# Patient Record
Sex: Female | Born: 1980 | Hispanic: No | Smoking: Former smoker
Health system: Southern US, Community
[De-identification: ages and names within clinical notes are randomized; demographics above are authoritative.]

## PROBLEM LIST (undated history)

## (undated) DIAGNOSIS — F329 Major depressive disorder, single episode, unspecified: Secondary | ICD-10-CM

## (undated) DIAGNOSIS — F32A Depression, unspecified: Secondary | ICD-10-CM

## (undated) DIAGNOSIS — F419 Anxiety disorder, unspecified: Secondary | ICD-10-CM

## (undated) DIAGNOSIS — G8929 Other chronic pain: Secondary | ICD-10-CM

## (undated) DIAGNOSIS — Z87891 Personal history of nicotine dependence: Secondary | ICD-10-CM

## (undated) DIAGNOSIS — M25519 Pain in unspecified shoulder: Secondary | ICD-10-CM

## (undated) HISTORY — DX: Pain in unspecified shoulder: M25.519

## (undated) HISTORY — DX: Other chronic pain: G89.29

## (undated) HISTORY — PX: ARTHROSCOPIC REPAIR ACL: SUR80

## (undated) HISTORY — DX: Personal history of nicotine dependence: Z87.891

---

## 2004-06-15 ENCOUNTER — Other Ambulatory Visit: Admission: RE | Admit: 2004-06-15 | Discharge: 2004-06-15 | Payer: Self-pay | Admitting: Obstetrics and Gynecology

## 2011-03-02 ENCOUNTER — Emergency Department (HOSPITAL_COMMUNITY)
Admission: EM | Admit: 2011-03-02 | Discharge: 2011-03-03 | Disposition: A | Discharge status: Home / Self Care | Payer: PRIVATE HEALTH INSURANCE | Attending: Emergency Medicine | Admitting: Emergency Medicine

## 2011-03-02 ENCOUNTER — Emergency Department (HOSPITAL_COMMUNITY): Payer: PRIVATE HEALTH INSURANCE

## 2011-03-02 ENCOUNTER — Encounter: Payer: Self-pay | Admitting: Emergency Medicine

## 2011-03-02 DIAGNOSIS — S8990XA Unspecified injury of unspecified lower leg, initial encounter: Secondary | ICD-10-CM | POA: Insufficient documentation

## 2011-03-02 DIAGNOSIS — IMO0002 Reserved for concepts with insufficient information to code with codable children: Secondary | ICD-10-CM | POA: Insufficient documentation

## 2011-03-02 DIAGNOSIS — M25569 Pain in unspecified knee: Secondary | ICD-10-CM | POA: Insufficient documentation

## 2011-03-02 DIAGNOSIS — S82899A Other fracture of unspecified lower leg, initial encounter for closed fracture: Secondary | ICD-10-CM | POA: Insufficient documentation

## 2011-03-02 HISTORY — DX: Major depressive disorder, single episode, unspecified: F32.9

## 2011-03-02 HISTORY — DX: Anxiety disorder, unspecified: F41.9

## 2011-03-02 HISTORY — DX: Depression, unspecified: F32.A

## 2011-03-02 MED ORDER — IBUPROFEN 600 MG PO TABS: 600.0000 mg | ORAL_TABLET | Freq: Four times a day (QID) | ORAL | Status: AC | PRN

## 2011-03-02 MED ORDER — OXYCODONE-ACETAMINOPHEN 5-325 MG PO TABS: 1.0000 | ORAL_TABLET | ORAL | Status: AC | PRN

## 2011-03-02 MED ORDER — IBUPROFEN 800 MG PO TABS: 800.0000 mg | ORAL_TABLET | Freq: Once | ORAL | Status: DC

## 2011-03-02 MED ORDER — MORPHINE SULFATE 4 MG/ML IJ SOLN
6.0000 mg | Freq: Once | INTRAMUSCULAR | Status: AC
  Administered 2011-03-02: 6 mg via INTRAMUSCULAR
  Filled 2011-03-02: qty 2

## 2011-03-02 MED ORDER — OXYCODONE-ACETAMINOPHEN 5-325 MG PO TABS: 1.0000 | ORAL_TABLET | Freq: Once | ORAL | Status: DC

## 2011-03-02 NOTE — ED Notes (Signed)
Pt presented to the Er with c/o of a left knee pain secondary to left knee injury and fall, pt states she was tripped by a dog, pt states "I twisted the leg wrong way and fell". Pt level at this time upon movement 8/10, at best 3/10. Pt able to move the left leg and the knee however with some difficulty. Also noted minimal swelling to the lateral left side. No bruising or deformity noted at this time.

## 2011-03-02 NOTE — ED Provider Notes (Signed)
History     CSN: [id number]  Arrival date & time 03/02/11  1958   First MD Initiated Contact with Patient 03/02/11 2135      Chief Complaint  Patient presents with  . Fall  . Knee Injury  . Knee Pain    (Consider location/radiation/quality/duration/timing/severity/associated sxs/prior treatment) Patient is a 30 y.o. female presenting with knee pain. The history is provided by the patient.  Knee Pain The current episode started today. The problem occurs constantly. The problem has been unchanged. Associated symptoms include joint swelling. Pertinent negatives include no chills or fever. The symptoms are aggravated by bending, standing, walking and twisting. She has tried nothing for the symptoms.  Pt states she was getting out of a car when a 30lb dog jumped on her and she fell down. Reports pain and swelling to left knee, unble to bear weight. No other injuries.   Past Medical History  Diagnosis Date  . Depression   . Anxiety     History reviewed. No pertinent past surgical history.  History reviewed. No pertinent family history.  History  Substance Use Topics  . Smoking status: Current Some Day Smoker  . Smokeless tobacco: Not on file  . Alcohol Use: Yes    OB History    Grav Para Term Preterm Abortions TAB SAB Ect Mult Living                  Review of Systems  Constitutional: Negative for fever and chills.  HENT: Negative.   Eyes: Negative.   Respiratory: Negative.   Cardiovascular: Negative.   Gastrointestinal: Negative.   Genitourinary: Negative.   Musculoskeletal: Positive for joint swelling.  Skin: Negative.   Neurological: Negative.   Psychiatric/Behavioral: Negative.     Allergies  Review of patient's allergies indicates no known allergies.  Home Medications   Current Outpatient Rx  Name Route Sig Dispense Refill  . ALPRAZOLAM 0.5 MG PO TABS Oral Take 0.5 mg by mouth at bedtime as needed. Take 0.25 mg to 0.5 mg at bedtime as needed for  anxiety     . BUPROPION HCL ER (XL) 150 MG PO TB24 Oral Take 150 mg by mouth daily.      . ETONOGESTREL-ETHINYL ESTRADIOL 0.12-0.015 MG/24HR VA RING Vaginal Place 1 each vaginally every 28 (twenty-eight) days. Insert vaginally and leave in place for 3 consecutive weeks, then remove for 1 week.     Marland Kitchen FLUOXETINE HCL 40 MG PO CAPS Oral Take 40 mg by mouth daily after breakfast.      . ADULT MULTIVITAMIN W/MINERALS CH Oral Take 1 tablet by mouth daily.      Marland Kitchen PSEUDOEPHEDRINE-APAP-DM 91-478-29 MG/30ML PO LIQD Oral Take 15 mLs by mouth every 4 (four) hours as needed. For congestion relief       BP 103/66  Pulse 82  Temp(Src) 98.7 F (37.1 C) (Oral)  Resp 18  SpO2 100%  Physical Exam  Constitutional: She is oriented to person, place, and time. She appears well-developed and well-nourished.  HENT:  Head: Atraumatic.  Neck: Neck supple.  Cardiovascular: Normal rate, regular rhythm and normal heart sounds.   Pulmonary/Chest: Effort normal and breath sounds normal. No respiratory distress.  Abdominal: Soft. Bowel sounds are normal. She exhibits no distension.  Musculoskeletal: She exhibits edema and tenderness.       Left knee swelling noted. Tender to palpation over posterior knee, and anterior knee just medial to the patella. Pt able to raise the leg off the stretcher. Patella appears  intact. No pain or laxaty with medial or lateral stress. Unable to flex knee due to pain and swelling. Unable assess joint stability due to pain. Normal ankle and hip  Neurological: She is alert and oriented to person, place, and time.  Skin: Skin is warm and dry.  Psychiatric: She has a normal mood and affect.    ED Course  Procedures (including critical care time)  No results found for this or any previous visit. Dg Knee Complete 4 Views Left  03/02/2011  *RADIOLOGY REPORT*  Clinical Data: Knocked down by dog; left knee pain.  LEFT KNEE - COMPLETE 4+ VIEW  Comparison: None.  Findings: There is a comminuted  fracture involving the tibial eminence, with several displaced fragments.  This raises concern for disruption of the distal insertion of the anterior cruciate ligament.  An associated large lipohemarthrosis is seen.  No additional fractures are identified.  The joint spaces are preserved.  No significant degenerative change is seen; the patellofemoral joint is otherwise unremarkable in appearance.  IMPRESSION: Comminuted fracture involving the tibial eminence, with several displaced fragments; this raises concern for disruption of the distal insertion of the anterior cruciate ligament.  Associated large lipohemarthrosis seen.  Original Report Authenticated By: Tonia Ghent, M.D.    Spoke with Dr. Jerl Santos, orthopedics, regarding x-ray findings. Recommended immobilizer, crutches, follow up with them in the office. Discussed x-ray findings with pt. Will d/c home  No diagnosis found.    MDM          Lottie Mussel, PA 03/03/11 [phone]

## 2011-03-02 NOTE — ED Notes (Signed)
Pt returned from xray dept. 

## 2011-03-03 NOTE — ED Notes (Signed)
14 inch knee immobilizer placed on left knee,  Crutches given for walking,  Pt wheeled out by wheelchair

## 2011-03-03 NOTE — ED Provider Notes (Signed)
Medical screening examination/treatment/procedure(s) were performed by non-physician practitioner and as supervising physician I was immediately available for consultation/collaboration.  Vartan Kerins M Jessicalynn Deshong, MD 03/03/11 1313 

## 2011-07-22 ENCOUNTER — Ambulatory Visit (INDEPENDENT_AMBULATORY_CARE_PROVIDER_SITE_OTHER): Payer: PRIVATE HEALTH INSURANCE | Admitting: Emergency Medicine

## 2011-07-22 VITALS — BP 114/74 | HR 91 | Temp 98.0°F | Resp 16 | Ht 64.5 in | Wt 128.2 lb

## 2011-07-22 DIAGNOSIS — B373 Candidiasis of vulva and vagina: Secondary | ICD-10-CM

## 2011-07-22 DIAGNOSIS — N390 Urinary tract infection, site not specified: Secondary | ICD-10-CM

## 2011-07-22 LAB — POCT URINALYSIS DIPSTICK
Protein, UA: NEGATIVE
pH, UA: 7

## 2011-07-22 LAB — POCT UA - MICROSCOPIC ONLY
Crystals, Ur, HPF, POC: NEGATIVE
Yeast, UA: NEGATIVE

## 2011-07-22 MED ORDER — CIPROFLOXACIN HCL 250 MG PO TABS: 250.0000 mg | ORAL_TABLET | Freq: Two times a day (BID) | ORAL | Status: AC

## 2011-07-22 MED ORDER — FLUCONAZOLE 150 MG PO TABS: ORAL_TABLET | ORAL | Status: DC

## 2011-07-22 NOTE — Progress Notes (Signed)
  Subjective:    Patient ID: Zoe Ryan, female    DOB: October 29, 1980, 31 y.o.   MRN: [id number]  HPI patient enters with a 24-hour history of burning on urination. She has a history of urinary tract infections. She feels a lot of discomfort in her lower abdomen with burning at the end of urination.    Review of Systems she denies fevers chills. She denies back pain. She uses a NuvaRing for birth control. Her menstrual cycles have been normal she's had no new sexual partner     Objective:   Physical Exam limited to the abdomen. There is mild tenderness in the suprapubic area. There is no tenderness in the flank areas.   Results for orders placed in visit on 07/22/11  POCT UA - MICROSCOPIC ONLY      Component Value Range   WBC, Ur, HPF, POC 8-15     RBC, urine, microscopic 1-4     Bacteria, U Microscopic 1+     Mucus, UA neg     Epithelial cells, urine per micros 0-3     Crystals, Ur, HPF, POC neg     Casts, Ur, LPF, POC neg     Yeast, UA neg    POCT URINALYSIS DIPSTICK      Component Value Range   Color, UA Bright yellow     Clarity, UA clear     Glucose, UA neg     Bilirubin, UA neg     Ketones, UA neg     Spec Grav, UA 1.015     Blood, UA neg     pH, UA 7.0     Protein, UA neg     Urobilinogen, UA 0.2     Nitrite, UA positve     Leukocytes, UA moderate (2+)         Assessment & Plan:       Patient seen with urinary tract infection. We'll culture urine with Cipro 250 twice a day x7 days. Patient given a prescription for Diflucan.

## 2011-07-22 NOTE — Patient Instructions (Signed)

## 2011-07-26 ENCOUNTER — Telehealth: Payer: Self-pay | Admitting: *Deleted

## 2011-07-26 NOTE — Telephone Encounter (Signed)
We left results of urine culture and pt called back and stated she is still having abdominal pain and dysuria.  She only has about 3 days left of antibiotic.  Would you like for her to RTC or is there anything we can do without her coming in?

## 2011-07-26 NOTE — Telephone Encounter (Signed)
Spoke with patient and advised to RTC and voiced understanding .

## 2011-07-26 NOTE — Telephone Encounter (Signed)
If does not resolve should RTC for further testing.

## 2011-07-27 ENCOUNTER — Ambulatory Visit (INDEPENDENT_AMBULATORY_CARE_PROVIDER_SITE_OTHER): Payer: PRIVATE HEALTH INSURANCE | Admitting: Family Medicine

## 2011-07-27 VITALS — BP 101/68 | HR 93 | Temp 99.0°F | Resp 16 | Ht 64.0 in | Wt 128.0 lb

## 2011-07-27 DIAGNOSIS — N949 Unspecified condition associated with female genital organs and menstrual cycle: Secondary | ICD-10-CM

## 2011-07-27 DIAGNOSIS — N390 Urinary tract infection, site not specified: Secondary | ICD-10-CM

## 2011-07-27 DIAGNOSIS — R102 Pelvic and perineal pain: Secondary | ICD-10-CM

## 2011-07-27 LAB — POCT URINALYSIS DIPSTICK
Bilirubin, UA: NEGATIVE
Blood, UA: NEGATIVE
Ketones, UA: NEGATIVE
Leukocytes, UA: NEGATIVE
pH, UA: 6

## 2011-07-27 LAB — POCT UA - MICROSCOPIC ONLY
Bacteria, U Microscopic: NEGATIVE
Casts, Ur, LPF, POC: NEGATIVE
Mucus, UA: NEGATIVE

## 2011-07-27 LAB — POCT WET PREP WITH KOH: Yeast Wet Prep HPF POC: NEGATIVE

## 2011-07-27 MED ORDER — DOXYCYCLINE HYCLATE 100 MG PO TABS: 100.0000 mg | ORAL_TABLET | Freq: Two times a day (BID) | ORAL | Status: AC

## 2011-07-27 MED ORDER — FLUCONAZOLE 150 MG PO TABS: ORAL_TABLET | ORAL | Status: DC

## 2011-07-27 MED ORDER — DOXYCYCLINE HYCLATE 100 MG PO TABS: 100.0000 mg | ORAL_TABLET | Freq: Two times a day (BID) | ORAL | Status: DC

## 2011-07-27 NOTE — Progress Notes (Signed)
  Subjective:    Patient ID: Margarete Horace, female    DOB: [birth year], 31 y.o.   MRN: [id number]  HPI  Patient presents for follow up of UTI UC grew Coagulase negative Staph Day #6 Cipro  Continues to experience dysuria and suprapubic pain Chills Nausea and dizziness patient attributes to Cipro  Vaginal discomfort Denies new sexual contacts  Review of Systems     Objective:   Physical Exam  Constitutional: She appears well-developed.  Neck: Neck supple.  Cardiovascular: Normal rate, regular rhythm and normal heart sounds.   Pulmonary/Chest: Effort normal and breath sounds normal.  Abdominal: Soft. There is Tenderness: suprapubic.Marland Kitchen  Neurological: She is alert.  Skin: Skin is warm.       Results for orders placed in visit on 07/27/11  POCT URINALYSIS DIPSTICK      Component Value Range   Color, UA yellow     Clarity, UA clear     Glucose, UA neg     Bilirubin, UA neg     Ketones, UA neg     Spec Grav, UA 1.010     Blood, UA neg     pH, UA 6.0     Protein, UA neg     Urobilinogen, UA 0.2     Nitrite, UA neg     Leukocytes, UA Negative    POCT UA - MICROSCOPIC ONLY      Component Value Range   WBC, Ur, HPF, POC neg     RBC, urine, microscopic neg     Bacteria, U Microscopic neg     Mucus, UA neg     Epithelial cells, urine per micros 0-1     Crystals, Ur, HPF, POC neg     Casts, Ur, LPF, POC neg     Yeast, UA neg    POCT WET PREP WITH KOH      Component Value Range   Trichomonas, UA Negative     Clue Cells Wet Prep HPF POC 2-5     Epithelial Wet Prep HPF POC 3-6     Yeast Wet Prep HPF POC negative     Bacteria Wet Prep HPF POC 2+     RBC Wet Prep HPF POC 0-3     WBC Wet Prep HPF POC 2-5     KOH Prep POC Positive          Assessment & Plan:  Dysuria; recent urine culture with cogulase negative staph Yeast vaginitis  Doxycycline 100 mg 1 po BID X 7 days Diflucan UAD If symptoms persist patient will follow up for pelvic exam

## 2012-01-01 ENCOUNTER — Other Ambulatory Visit: Payer: Self-pay

## 2012-01-01 MED ORDER — ETONOGESTREL-ETHINYL ESTRADIOL 0.12-0.015 MG/24HR VA RING: VAGINAL_RING | VAGINAL | Status: DC

## 2012-03-03 ENCOUNTER — Ambulatory Visit (INDEPENDENT_AMBULATORY_CARE_PROVIDER_SITE_OTHER): Payer: PRIVATE HEALTH INSURANCE | Admitting: Family Medicine

## 2012-03-03 ENCOUNTER — Encounter: Payer: Self-pay | Admitting: Family Medicine

## 2012-03-03 VITALS — BP 120/81 | HR 72 | Temp 98.2°F | Resp 16 | Ht 65.5 in | Wt 125.0 lb

## 2012-03-03 DIAGNOSIS — Z0000 Encounter for general adult medical examination without abnormal findings: Secondary | ICD-10-CM

## 2012-03-03 MED ORDER — ETONOGESTREL-ETHINYL ESTRADIOL 0.12-0.015 MG/24HR VA RING: VAGINAL_RING | VAGINAL | Status: DC

## 2012-03-03 NOTE — Progress Notes (Signed)
Subjective:    Patient ID: Zoe Ryan, female    DOB: Jul 06, 1980, 31 y.o.   MRN: [id number]  HPI  [name] is a pleasant 31 yo female with no sig PMHx. She has been stable on her psychiatric medications for over 7 yrs and is followed by Dr. Tomasa Rand at [facility]. She has no plans to consider conception anytime soon and knows that her medications would need to be reviewed before this.   She is using nuvaring for contraception - she is often using the rings back to back to eliminate periods which is fine. She is taking a mvi and making sure to get her calcium/vit D. Doing monthly self breast exams.  Thinks she may have had abnormal pap once but repeat was normal.  Past Medical History  Diagnosis Date  . Depression   . Anxiety   . Chronic shoulder pain   . Tobacco user    Past Surgical History  Procedure Date  . Arthroscopic repair acl    Family History  Problem Relation Age of Onset  . Anxiety disorder Maternal Grandmother   . Congestive Heart Failure Maternal Grandfather   . Stroke Paternal Grandfather    History   Social History  . Marital Status: Single    Spouse Name: N/A    Number of Children: N/A  . Years of Education: N/A   Occupational History  . 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 Smoking status: Current Some Day Smoker  . Smokeless tobacco: Not on file  . Alcohol Use: Yes  . Drug Use: No  . Sexually Active: Yes    Birth Control/ Protection: Pill   Other Topics Concern  . Not on file   Social History Narrative  . No narrative on file   Current Outpatient Prescriptions on File Prior to Visit  Medication Sig Dispense Refill  . ALPRAZolam (XANAX) 0.5 MG tablet Take 0.5 mg by mouth at bedtime as needed. Take 0.25 mg to 0.5 mg at bedtime as needed for anxiety       . buPROPion (WELLBUTRIN XL) 150 MG 24 hr tablet Take 150 mg by mouth daily.        Marland Kitchen etonogestrel-ethinyl estradiol (NUVARING) 0.12-0.015  MG/24HR vaginal ring Insert 1 ring and leave in place for 3 weeks. Then remove for 1 week and repeat cycle with new ring.  3 each  11  . FLUoxetine (PROZAC) 40 MG capsule Take 40 mg by mouth daily after breakfast.        . Multiple Vitamin (MULITIVITAMIN WITH MINERALS) TABS Take 1 tablet by mouth daily.        . fluconazole (DIFLUCAN) 150 MG tablet UAD  3 tablet  1  . Pseudoephedrine-APAP-DM (DAYQUIL MULTI-SYMPTOM) 60-650-20 MG/30ML LIQD Take 15 mLs by mouth every 4 (four) hours as needed. For congestion relief        No Known Allergies   Review of Systems  Constitutional: Negative for fever, chills, diaphoresis, activity change, appetite change, fatigue and unexpected weight change.  HENT: Negative for hearing loss, ear pain, nosebleeds, congestion, sore throat, facial swelling, rhinorrhea, sneezing, drooling, mouth sores, trouble swallowing, neck pain, neck stiffness, dental problem, voice change, postnasal drip, sinus pressure, tinnitus and ear discharge.   Eyes: Negative for photophobia, pain, discharge, redness, itching and visual disturbance.  Respiratory: Negative for apnea, cough, choking, chest tightness, shortness of breath, wheezing and stridor.   Cardiovascular: Negative for chest pain, palpitations  and leg swelling.  Gastrointestinal: Negative for nausea, vomiting, abdominal pain, diarrhea, constipation, blood in stool, abdominal distention, anal bleeding and rectal pain.  Genitourinary: Negative for dysuria, urgency, frequency, hematuria, flank pain, decreased urine volume, vaginal bleeding, vaginal discharge, enuresis, difficulty urinating, genital sores, vaginal pain, menstrual problem, pelvic pain and dyspareunia.  Musculoskeletal: Negative for myalgias, back pain, joint swelling, arthralgias and gait problem.  Skin: Negative for color change, pallor, rash and wound.  Neurological: Negative for dizziness, tremors, seizures, syncope, facial asymmetry, speech difficulty, weakness,  light-headedness, numbness and headaches.  Hematological: Negative for adenopathy. Does not bruise/bleed easily.  Psychiatric/Behavioral: Negative for suicidal ideas, hallucinations, behavioral problems, confusion, sleep disturbance, self-injury, dysphoric mood, decreased concentration and agitation. The patient is not nervous/anxious and is not hyperactive.       BP 120/81  Pulse 72  Temp 98.2 F (36.8 C)  Resp 16  Ht 5' 5.5" (1.664 m)  Wt 125 lb (56.7 kg)  BMI 20.48 kg/m2  LMP 02/09/2012 Objective:   Physical Exam  Constitutional: She is oriented to person, place, and time. She appears well-developed and well-nourished. No distress.  HENT:  Head: Normocephalic and atraumatic.  Right Ear: Tympanic membrane, external ear and ear canal normal.  Left Ear: Tympanic membrane, external ear and ear canal normal.  Nose: Nose normal. No rhinorrhea.  Mouth/Throat: Uvula is midline, oropharynx is clear and moist and mucous membranes are normal. No posterior oropharyngeal erythema.  Eyes: Conjunctivae normal and EOM are normal. Pupils are equal, round, and reactive to light. Right eye exhibits no discharge. Left eye exhibits no discharge. No scleral icterus.  Neck: Normal range of motion. Neck supple. No thyromegaly present.  Cardiovascular: Normal rate, regular rhythm, normal heart sounds and intact distal pulses.   Pulmonary/Chest: Effort normal and breath sounds normal. No respiratory distress.  Abdominal: Soft. Bowel sounds are normal. There is no tenderness.  Genitourinary: Vagina normal and uterus normal. No breast swelling, tenderness, discharge or bleeding. Cervix exhibits no motion tenderness and no friability. Right adnexum displays no mass and no tenderness. Left adnexum displays no mass and no tenderness.  Musculoskeletal: She exhibits no edema.  Lymphadenopathy:    She has no cervical adenopathy.  Neurological: She is alert and oriented to person, place, and time. She has normal  reflexes.  Skin: Skin is warm and dry. She is not diaphoretic. No erythema.  Psychiatric: She has a normal mood and affect. Her behavior is normal.      Assessment & Plan:  1. HM - Pap repeated this yr - could have deferred for 2 more years but decided to go ahead and do this yr along w/ HR HPV testing as if both are negative, can transition to pap smear only every 5 yrs - so none until 2018. Flu shot at work, TDaP done 12/16/10

## 2012-03-09 LAB — PAP IG AND HPV HIGH-RISK

## 2013-02-26 ENCOUNTER — Ambulatory Visit: Payer: PRIVATE HEALTH INSURANCE

## 2013-02-26 ENCOUNTER — Ambulatory Visit (INDEPENDENT_AMBULATORY_CARE_PROVIDER_SITE_OTHER): Payer: PRIVATE HEALTH INSURANCE | Admitting: Physician Assistant

## 2013-02-26 VITALS — BP 122/78 | HR 100 | Temp 99.1°F | Resp 18 | Ht 64.0 in | Wt 121.0 lb

## 2013-02-26 DIAGNOSIS — R05 Cough: Secondary | ICD-10-CM

## 2013-02-26 LAB — POCT CBC
Lymph, poc: 2.1 (ref 0.6–3.4)
MCH, POC: 32.8 pg — AB (ref 27–31.2)
MCHC: 32.3 g/dL (ref 31.8–35.4)
MCV: 101.2 fL — AB (ref 80–97)
MID (cbc): 0.6 (ref 0–0.9)
POC LYMPH PERCENT: 44.4 %L (ref 10–50)
Platelet Count, POC: 264 10*3/uL (ref 142–424)
RDW, POC: 13.1 %
WBC: 4.7 10*3/uL (ref 4.6–10.2)

## 2013-02-26 MED ORDER — HYDROCOD POLST-CHLORPHEN POLST 10-8 MG/5ML PO LQCR: 5.0000 mL | Freq: Two times a day (BID) | ORAL | Status: DC | PRN

## 2013-02-26 MED ORDER — GUAIFENESIN ER 1200 MG PO TB12: 1.0000 | ORAL_TABLET | Freq: Two times a day (BID) | ORAL | Status: DC | PRN

## 2013-02-26 MED ORDER — IPRATROPIUM BROMIDE 0.03 % NA SOLN: 2.0000 | Freq: Two times a day (BID) | NASAL | Status: DC

## 2013-02-26 MED ORDER — AZITHROMYCIN 250 MG PO TABS: ORAL_TABLET | ORAL | Status: DC

## 2013-02-26 NOTE — Progress Notes (Signed)
   Subjective:    Patient ID: Zoe Ryan, female    DOB: 1980/11/11, 32 y.o.   MRN: [id number]  PCP: No primary provider on file.  Chief Complaint  Patient presents with  . Cough    x Wednesday  . Nasal Congestion    HPI Began with laryngitis, then coughing.  "Then it kind of went into my head."  Cough produces a clearish-yellowish sputum. Same color drainage from her nose. Subjective fever/chills. Tmax 99.8. No nausea, vomiting, diarrhea, though has coughed to the point of gagging and vomiting. No unexplained muscle/joint aches, but hurts to cough.   Review of Systems As above.    Objective:   Physical Exam Blood pressure 122/78, pulse 100, temperature 99.1 F (37.3 C), temperature source Oral, resp. rate 18, height 5\' 4"  (1.626 m), weight 121 lb (54.885 kg), SpO2 96.00%. Body mass index is 20.76 kg/(m^2). Well-developed, well nourished female who is awake, alert and oriented, in NAD. HEENT: [place]/AT, PERRL, EOMI.  Sclera and conjunctiva are clear.  EAC are patent, TMs are normal in appearance. Nasal mucosa is pink and moist. OP is clear. Neck: supple, non-tender, no lymphadenopathy, thyromegaly. Heart: RRR, no murmur Lungs: normal effort, wheezes noted in the bases. Abdomen: normo-active bowel sounds, supple, non-tender, no mass or organomegaly. Extremities: no cyanosis, clubbing or edema. Skin: warm and dry without rash. Psychologic: good mood and appropriate affect, normal speech and behavior.    CXR: [facility] reading (PRIMARY) by  Dr. Alwyn Ren.  Heavy markings.  No infiltrates.  Results for orders placed in visit on 02/26/13  POCT CBC      Result Value Range   WBC 4.7  4.6 - 10.2 K/uL   Lymph, poc 2.1  0.6 - 3.4   POC LYMPH PERCENT 44.4  10 - 50 %L   MID (cbc) 0.6  0 - 0.9   POC MID % 12.0  0 - 12 %M   POC Granulocyte 2.0  2 - 6.9   Granulocyte percent 43.6  37 - 80 %G   RBC 4.61  4.04 - 5.48 M/uL   Hemoglobin 15.1  12.2 - 16.2 g/dL   HCT, POC 40.9  81.1 - 47.9 %   MCV 101.2 (*) 80 - 97 fL   MCH, POC 32.8 (*) 27 - 31.2 pg   MCHC 32.3  31.8 - 35.4 g/dL   RDW, POC 91.4     Platelet Count, POC 264  142 - 424 K/uL   MPV 7.1  0 - 99.8 fL       Assessment & Plan:  1. Cough bronchitis - POCT CBC - DG Chest 2 View; Future - azithromycin (ZITHROMAX) 250 MG tablet; Take 2 tabs PO x 1 dose, then 1 tab PO QD x 4 days  Dispense: 6 tablet; Refill: 0 - ipratropium (ATROVENT) 0.03 % nasal spray; Place 2 sprays into both nostrils 2 (two) times daily.  Dispense: 30 mL; Refill: 0 - Guaifenesin (MUCINEX MAXIMUM STRENGTH) 1200 MG TB12; Take 1 tablet (1,200 mg total) by mouth every 12 (twelve) hours as needed.  Dispense: 14 tablet; Refill: 1 - chlorpheniramine-HYDROcodone (TUSSIONEX PENNKINETIC ER) 10-8 MG/5ML LQCR; Take 5 mLs by mouth every 12 (twelve) hours as needed for cough (cough).  Dispense: 100 mL; Refill: 0   Fernande Bras, PA-C Physician Assistant-Certified [facility]

## 2013-02-26 NOTE — Patient Instructions (Signed)
Get plenty of rest and drink at least 64 ounces of water daily. 

## 2013-04-22 ENCOUNTER — Other Ambulatory Visit: Payer: Self-pay | Admitting: Family Medicine

## 2013-05-31 ENCOUNTER — Other Ambulatory Visit: Payer: Self-pay | Admitting: Family Medicine

## 2013-06-01 ENCOUNTER — Other Ambulatory Visit: Payer: Self-pay | Admitting: Family Medicine

## 2013-06-01 ENCOUNTER — Telehealth: Payer: Self-pay

## 2013-06-01 NOTE — Telephone Encounter (Signed)
PHARMACY WOULD NOT GIVE PT REFILL OF BIRTH CONTROL. PT WANTS  TO KNOW IF SHE NEEDS TO COME BACK IN AND BE SEEN TO GET A REFILL.

## 2013-06-03 NOTE — Telephone Encounter (Signed)
Her last CPE was 02/2012 - she needs to come in for wellness exam

## 2013-06-04 MED ORDER — ETONOGESTREL-ETHINYL ESTRADIOL 0.12-0.015 MG/24HR VA RING: VAGINAL_RING | VAGINAL | Status: DC

## 2013-06-04 NOTE — Telephone Encounter (Signed)
Spoke to pt- she was transferred to make an appt with Chelle. [facility]  Sent in one month supply- Verbal from Chelle ok to do so. Verified pharmacy.

## 2013-06-21 ENCOUNTER — Ambulatory Visit (INDEPENDENT_AMBULATORY_CARE_PROVIDER_SITE_OTHER): Payer: PRIVATE HEALTH INSURANCE | Admitting: Emergency Medicine

## 2013-06-21 DIAGNOSIS — Z3049 Encounter for surveillance of other contraceptives: Secondary | ICD-10-CM

## 2013-06-21 DIAGNOSIS — Z0000 Encounter for general adult medical examination without abnormal findings: Secondary | ICD-10-CM

## 2013-06-21 LAB — POCT URINALYSIS DIPSTICK
Bilirubin, UA: NEGATIVE
GLUCOSE UA: NEGATIVE
Ketones, UA: NEGATIVE
Leukocytes, UA: NEGATIVE
Nitrite, UA: NEGATIVE
PH UA: 6
Protein, UA: NEGATIVE
RBC UA: NEGATIVE
UROBILINOGEN UA: 0.2

## 2013-06-21 LAB — POCT CBC
Granulocyte percent: 57.4 %G (ref 37–80)
HCT, POC: 45.1 % (ref 37.7–47.9)
Hemoglobin: 14.9 g/dL (ref 12.2–16.2)
Lymph, poc: 1.9 (ref 0.6–3.4)
MCH, POC: 33 pg — AB (ref 27–31.2)
MCHC: 33 g/dL (ref 31.8–35.4)
MCV: 99.9 fL — AB (ref 80–97)
MID (cbc): 0.3 (ref 0–0.9)
MPV: 7.6 fL (ref 0–99.8)
POC Granulocyte: 3 (ref 2–6.9)
POC LYMPH PERCENT: 36.4 %L (ref 10–50)
POC MID %: 6.2 %M (ref 0–12)
Platelet Count, POC: 408 10*3/uL (ref 142–424)
RBC: 4.51 M/uL (ref 4.04–5.48)
RDW, POC: 13.2 %
WBC: 5.2 10*3/uL (ref 4.6–10.2)

## 2013-06-21 LAB — POCT UA - MICROSCOPIC ONLY
Bacteria, U Microscopic: NEGATIVE
Casts, Ur, LPF, POC: NEGATIVE
Crystals, Ur, HPF, POC: NEGATIVE
Epithelial cells, urine per micros: NEGATIVE
Mucus, UA: NEGATIVE
RBC, urine, microscopic: NEGATIVE
Yeast, UA: NEGATIVE

## 2013-06-21 LAB — COMPREHENSIVE METABOLIC PANEL
ALT: 20 U/L (ref 0–35)
AST: 21 U/L (ref 0–37)
Albumin: 4.5 g/dL (ref 3.5–5.2)
Alkaline Phosphatase: 60 U/L (ref 39–117)
BILIRUBIN TOTAL: 0.3 mg/dL (ref 0.2–1.2)
BUN: 8 mg/dL (ref 6–23)
CALCIUM: 9.7 mg/dL (ref 8.4–10.5)
CHLORIDE: 101 meq/L (ref 96–112)
CO2: 25 meq/L (ref 19–32)
CREATININE: 0.59 mg/dL (ref 0.50–1.10)
Glucose, Bld: 73 mg/dL (ref 70–99)
Potassium: 4.4 mEq/L (ref 3.5–5.3)
SODIUM: 136 meq/L (ref 135–145)
TOTAL PROTEIN: 8.2 g/dL (ref 6.0–8.3)

## 2013-06-21 LAB — TSH: TSH: 1.922 u[IU]/mL (ref 0.350–4.500)

## 2013-06-21 LAB — LIPID PANEL
CHOLESTEROL: 211 mg/dL — AB (ref 0–200)
HDL: 114 mg/dL (ref >39)
LDL Cholesterol: 82 mg/dL (ref 0–99)
Total CHOL/HDL Ratio: 1.9 Ratio
Triglycerides: 74 mg/dL (ref <150)
VLDL: 15 mg/dL (ref 0–40)

## 2013-06-21 MED ORDER — ETONOGESTREL-ETHINYL ESTRADIOL 0.12-0.015 MG/24HR VA RING: VAGINAL_RING | VAGINAL | Status: DC

## 2013-06-21 NOTE — Progress Notes (Signed)
[facility] [address], [place] [id number] [phone]  Date:  06/21/2013   Name:  Zoe Ryan   DOB:  07/23/1980   MRN:  [id number]  PCP:  No primary provider on file.    Chief Complaint: Annual Exam   History of Present Illness:  Zoe Ryan is a 33 y.o. very pleasant female patient who presents with the following:  Here for refill on contraceptive and for routine annual examination.  Non smoker.  No improvement with over the counter medications or other home remedies. Denies other complaint or health concern today.   There are no active problems to display for this patient.   Past Medical History  Diagnosis Date  . Depression   . Anxiety   . Chronic shoulder pain   . History of tobacco use     Quit 2013    Past Surgical History  Procedure Laterality Date  . Arthroscopic repair acl      History  Substance Use Topics  . Smoking status: Former Games developermoker  . Smokeless tobacco: Never Used  . Alcohol Use: 6.6 oz/week    11 Cans of beer per week    Family History  Problem Relation Age of Onset  . Anxiety disorder Maternal Grandmother   . Congestive Heart Failure Maternal Grandfather   . Stroke Paternal Grandfather   . Mental illness Brother     depression    No Known Allergies  Medication list has been reviewed and updated.  Current Outpatient Prescriptions on File Prior to Visit  Medication Sig Dispense Refill  . ALPRAZolam (XANAX) 0.5 MG tablet Take 0.5 mg by mouth at bedtime as needed. Take 0.25 mg to 0.5 mg at bedtime as needed for anxiety       . buPROPion (WELLBUTRIN XL) 150 MG 24 hr tablet Take 150 mg by mouth daily.        Marland Kitchen. etonogestrel-ethinyl estradiol (NUVARING) 0.12-0.015 MG/24HR vaginal ring Insert 1 ring vaginally once a month for 3 weeks, then remove for 1 wk. PATIENT NEEDS OFFICE VISIT FOR ADDITIONAL REFILLS  1 each  0  . FLUoxetine (PROZAC) 40 MG capsule Take 40 mg by mouth daily after breakfast.        . Multiple  Vitamin (MULITIVITAMIN WITH MINERALS) TABS Take 1 tablet by mouth daily.        Marland Kitchen. azithromycin (ZITHROMAX) 250 MG tablet Take 2 tabs PO x 1 dose, then 1 tab PO QD x 4 days  6 tablet  0  . chlorpheniramine-HYDROcodone (TUSSIONEX PENNKINETIC ER) 10-8 MG/5ML LQCR Take 5 mLs by mouth every 12 (twelve) hours as needed for cough (cough).  100 mL  0  . Guaifenesin (MUCINEX MAXIMUM STRENGTH) 1200 MG TB12 Take 1 tablet (1,200 mg total) by mouth every 12 (twelve) hours as needed.  14 tablet  1  . ipratropium (ATROVENT) 0.03 % nasal spray Place 2 sprays into both nostrils 2 (two) times daily.  30 mL  0  . Pseudoephedrine-APAP-DM (DAYQUIL MULTI-SYMPTOM) 60-650-20 MG/30ML LIQD Take 15 mLs by mouth every 4 (four) hours as needed. For congestion relief        No current facility-administered medications on file prior to visit.    Review of Systems:  As per HPI, otherwise negative.    Physical Examination: There were no vitals filed for this visit. There were no vitals filed for this visit. There is no weight on file to calculate BMI. Ideal Body Weight:    GEN: WDWN, NAD,  Non-toxic, A & O x 3 HEENT: Atraumatic, Normocephalic. Neck supple. No masses, No LAD. Ears and Nose: No external deformity. CV: RRR, No M/G/R. No JVD. No thrill. No extra heart sounds. PULM: CTA B, no wheezes, crackles, rhonchi. No retractions. No resp. distress. No accessory muscle use. ABD: S, NT, ND, +BS. No rebound. No HSM. EXTR: No c/c/e NEURO Normal gait.  PSYCH: Normally interactive. Conversant. Not depressed or anxious appearing.  Calm demeanor.    Assessment and Plan: Wellness examination Labs  Signed,  Phillips OdorJeffery Codey Burling, MD

## 2013-06-21 NOTE — Patient Instructions (Signed)

## 2013-07-27 ENCOUNTER — Encounter: Payer: PRIVATE HEALTH INSURANCE | Admitting: Family Medicine

## 2014-06-23 ENCOUNTER — Other Ambulatory Visit: Payer: Self-pay | Admitting: Emergency Medicine

## 2014-06-25 ENCOUNTER — Ambulatory Visit: Payer: PRIVATE HEALTH INSURANCE | Admitting: Family Medicine

## 2014-07-23 ENCOUNTER — Encounter: Payer: Self-pay | Admitting: Family Medicine

## 2014-07-23 ENCOUNTER — Ambulatory Visit (INDEPENDENT_AMBULATORY_CARE_PROVIDER_SITE_OTHER): Payer: PRIVATE HEALTH INSURANCE | Admitting: Family Medicine

## 2014-07-23 VITALS — BP 123/85 | HR 106 | Temp 98.8°F | Resp 16 | Ht 64.5 in | Wt 136.2 lb

## 2014-07-23 DIAGNOSIS — Z3009 Encounter for other general counseling and advice on contraception: Secondary | ICD-10-CM

## 2014-07-23 DIAGNOSIS — Z1389 Encounter for screening for other disorder: Secondary | ICD-10-CM | POA: Diagnosis not present

## 2014-07-23 DIAGNOSIS — Z139 Encounter for screening, unspecified: Secondary | ICD-10-CM

## 2014-07-23 DIAGNOSIS — R5383 Other fatigue: Secondary | ICD-10-CM | POA: Diagnosis not present

## 2014-07-23 DIAGNOSIS — Z0000 Encounter for general adult medical examination without abnormal findings: Secondary | ICD-10-CM | POA: Diagnosis not present

## 2014-07-23 DIAGNOSIS — J302 Other seasonal allergic rhinitis: Secondary | ICD-10-CM

## 2014-07-23 LAB — COMPREHENSIVE METABOLIC PANEL
ALT: 19 U/L (ref 0–35)
AST: 17 U/L (ref 0–37)
Albumin: 3.8 g/dL (ref 3.5–5.2)
Alkaline Phosphatase: 66 U/L (ref 39–117)
BILIRUBIN TOTAL: 0.3 mg/dL (ref 0.2–1.2)
BUN: 7 mg/dL (ref 6–23)
CO2: 22 mEq/L (ref 19–32)
Calcium: 8.5 mg/dL (ref 8.4–10.5)
Chloride: 100 mEq/L (ref 96–112)
Creat: 0.63 mg/dL (ref 0.50–1.10)
Glucose, Bld: 79 mg/dL (ref 70–99)
Potassium: 4.4 mEq/L (ref 3.5–5.3)
Sodium: 133 mEq/L — ABNORMAL LOW (ref 135–145)
Total Protein: 7.3 g/dL (ref 6.0–8.3)

## 2014-07-23 LAB — POCT URINALYSIS DIPSTICK
BILIRUBIN UA: NEGATIVE
Glucose, UA: NEGATIVE
Ketones, UA: NEGATIVE
Nitrite, UA: NEGATIVE
PH UA: 6
Protein, UA: NEGATIVE
RBC UA: NEGATIVE
SPEC GRAV UA: 1.015
Urobilinogen, UA: 0.2

## 2014-07-23 LAB — CBC
HCT: 40.7 % (ref 36.0–46.0)
Hemoglobin: 14 g/dL (ref 12.0–15.0)
MCH: 32.8 pg (ref 26.0–34.0)
MCHC: 34.4 g/dL (ref 30.0–36.0)
MCV: 95.3 fL (ref 78.0–100.0)
MPV: 8.8 fL (ref 8.6–12.4)
Platelets: 335 10*3/uL (ref 150–400)
RBC: 4.27 MIL/uL (ref 3.87–5.11)
RDW: 13.5 % (ref 11.5–15.5)
WBC: 6.3 10*3/uL (ref 4.0–10.5)

## 2014-07-23 LAB — TSH: TSH: 2.205 u[IU]/mL (ref 0.350–4.500)

## 2014-07-23 MED ORDER — ETONOGESTREL-ETHINYL ESTRADIOL 0.12-0.015 MG/24HR VA RING: VAGINAL_RING | VAGINAL | Status: DC

## 2014-07-23 MED ORDER — FLUTICASONE PROPIONATE 50 MCG/ACT NA SUSP: 2.0000 | Freq: Every day | NASAL | Status: DC

## 2014-07-23 NOTE — Progress Notes (Signed)
Subjective:    Patient ID: Norva Pavlovnna Haugan, female    DOB: 07/15/1980, 34 y.o.   MRN: [id number]  HPI This is a pleasant 34 yo female who presents today for CPE.   Last CPE- 06/21/13 Mammo- NA Pap- 03/03/13- normal, never abnormal Tdap- 12/16/10 Flu- annual Eye- regular Dental- regular Exercise-dance  Has some night time awakening. Falls back to sleep easukt. Doesn't feel rested. This is chronic with acute exacerbation. Work is stressful. 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 New equipment, new computer program, understaffed. Other aspects of her life are stable. She has been seeing Tiajuana AmassScott Cunningham for psych meds. He has left and she is trying to get in with a new provider. Mood good on current meds.   Past Medical History  Diagnosis Date  . Depression   . Anxiety   . Chronic shoulder pain   . History of tobacco use     Quit 2013   Past Surgical History  Procedure Laterality Date  . Arthroscopic repair acl     Family History  Problem Relation Age of Onset  . Anxiety disorder Maternal Grandmother   . Congestive Heart Failure Maternal Grandfather   . Stroke Paternal Grandfather   . Mental illness Brother     depression   History  Substance Use Topics  . Smoking status: Former Games developermoker  . Smokeless tobacco: Never Used  . Alcohol Use: 6.6 oz/week    11 Cans of beer per week   Medications, allergies, past medical history, surgical history, family history, social history and problem list reviewed and updated.   Review of Systems  Constitutional: Positive for unexpected weight change (feels like she has gained 5-10 pounds in last year).  HENT: Positive for postnasal drip (negative allergy workup. ) and tinnitus.   Eyes: Negative.   Respiratory: Negative.   Cardiovascular: Negative.   Gastrointestinal: Negative.   Endocrine: Negative.   Genitourinary: Negative.   Musculoskeletal: Positive for arthralgias (right shoulder/arm pain 2-3x per month. Better with  regular exercise. Relief with ibuprofen 400 mg.).  Skin: Negative.   Allergic/Immunologic: Negative.   Neurological: Negative.   Hematological: Negative.   Psychiatric/Behavioral: Negative.       Objective:   Physical Exam Physical Exam  Constitutional: She is oriented to person, place, and time. She appears well-developed and well-nourished. No distress.  HENT:  Head: Normocephalic and atraumatic.  Right Ear: External ear normal.  Left Ear: External ear normal.  Nose: Nose with mucosal edema.  Mouth/Throat: Oropharynx slightly erythematous. No oropharyngeal exudate.  Eyes: Conjunctivae are normal. Pupils are equal, round, and reactive to light.  Neck: Normal range of motion. Neck supple. No JVD present. No thyromegaly present.  Cardiovascular: Normal rate, regular rhythm, normal heart sounds and intact distal pulses.   Pulmonary/Chest: Effort normal and breath sounds normal. Abdominal: Soft. Bowel sounds are normal. She exhibits no distension and no mass. There is no tenderness. There is no rebound and no guarding.  Genitourinary: Vagina normal. Pelvic exam was performed with patient supine. There is no rash, tenderness, lesion or injury on the right labia. There is no rash, tenderness, lesion or injury on the left labia. Cervix exhibits no motion tenderness and no discharge. No vaginal discharge found.  Musculoskeletal: Normal range of motion. She exhibits no edema or tenderness.  Lymphadenopathy:    She has no cervical adenopathy.  Neurological: She is alert and oriented to person, place, and time. She has normal reflexes.  Skin: Skin is warm and dry.  She is not diaphoretic.  Psychiatric: She has a normal mood and affect. Her behavior is normal. Judgment and thought content normal.  Vitals reviewed.  BP 123/85 mmHg  Pulse 106  Temp(Src) 98.8 F (37.1 C) (Oral)  Resp 16  Ht 5' 4.5" (1.638 m)  Wt 136 lb 3.2 oz (61.78 kg)  BMI 23.03 kg/m2  SpO2 95% HR during auscultation 88.       Assessment & Plan:  1. Annual physical exam - Encouraged regular exercise, adding yoga/meditation/mindfulness to routine  2. Other seasonal allergic rhinitis - Provided written and verbal information regarding diagnosis and treatment. - fluticasone (FLONASE) 50 MCG/ACT nasal spray; Place 2 sprays into both nostrils daily.  Dispense: 16 g; Refill: 6  3. Other fatigue - CBC - Comprehensive metabolic panel - TSH - Vit D  25 hydroxy (rtn osteoporosis monitoring)  4. Screening for hematuria or proteinuria - POCT urinalysis dipstick  5. Screening for nephropathy - Comprehensive metabolic panel  6. Encounter for other general counseling or advice on contraception - etonogestrel-ethinyl estradiol (NUVARING) 0.12-0.015 MG/24HR vaginal ring; Insert 1 ring vaginally once a month for 3 wks, then remove for 1 wk.  Dispense: 1 each; Refill: 3   Olean Reeeborah Chany Woolworth, FNP-BC  [facility]  07/23/2014 12:14 PM

## 2014-07-23 NOTE — Patient Instructions (Signed)

## 2014-07-24 ENCOUNTER — Encounter: Payer: Self-pay | Admitting: Family Medicine

## 2014-07-24 ENCOUNTER — Other Ambulatory Visit: Payer: Self-pay | Admitting: Family Medicine

## 2014-07-24 DIAGNOSIS — E559 Vitamin D deficiency, unspecified: Secondary | ICD-10-CM

## 2014-07-24 LAB — VITAMIN D 25 HYDROXY (VIT D DEFICIENCY, FRACTURES): VIT D 25 HYDROXY: 7 ng/mL — AB (ref 30–100)

## 2015-06-25 ENCOUNTER — Ambulatory Visit (INDEPENDENT_AMBULATORY_CARE_PROVIDER_SITE_OTHER): Payer: PRIVATE HEALTH INSURANCE | Admitting: Urgent Care

## 2015-06-25 VITALS — BP 108/60 | HR 93 | Temp 97.9°F | Resp 18 | Ht 64.5 in | Wt 146.0 lb

## 2015-06-25 DIAGNOSIS — R300 Dysuria: Secondary | ICD-10-CM | POA: Diagnosis not present

## 2015-06-25 DIAGNOSIS — R102 Pelvic and perineal pain: Secondary | ICD-10-CM | POA: Diagnosis not present

## 2015-06-25 DIAGNOSIS — R350 Frequency of micturition: Secondary | ICD-10-CM

## 2015-06-25 DIAGNOSIS — N3090 Cystitis, unspecified without hematuria: Secondary | ICD-10-CM | POA: Diagnosis not present

## 2015-06-25 LAB — POC MICROSCOPIC URINALYSIS (UMFC): Mucus: ABSENT

## 2015-06-25 LAB — POCT URINALYSIS DIP (MANUAL ENTRY)
Bilirubin, UA: NEGATIVE
Glucose, UA: NEGATIVE
Ketones, POC UA: NEGATIVE
Nitrite, UA: NEGATIVE
Protein Ur, POC: 30 — AB
Spec Grav, UA: 1.02
Urobilinogen, UA: 0.2
pH, UA: 5.5

## 2015-06-25 MED ORDER — CIPROFLOXACIN HCL 500 MG PO TABS: 500.0000 mg | ORAL_TABLET | Freq: Two times a day (BID) | ORAL | Status: DC

## 2015-06-25 MED ORDER — FLUCONAZOLE 150 MG PO TABS: 150.0000 mg | ORAL_TABLET | Freq: Once | ORAL | Status: DC

## 2015-06-25 NOTE — Patient Instructions (Addendum)
 Urinary Tract Infection Urinary tract infections (UTIs) can develop anywhere along your urinary tract. Your urinary tract is your body's drainage system for removing wastes and extra water. Your urinary tract includes two kidneys, two ureters, a bladder, and a urethra. Your kidneys are a pair of bean-shaped organs. Each kidney is about the size of your fist. They are located below your ribs, one on each side of your spine. CAUSES Infections are caused by microbes, which are microscopic organisms, including fungi, viruses, and bacteria. These organisms are so small that they can only be seen through a microscope. Bacteria are the microbes that most commonly cause UTIs. SYMPTOMS  Symptoms of UTIs may vary by age and gender of the patient and by the location of the infection. Symptoms in young women typically include a frequent and intense urge to urinate and a painful, burning feeling in the bladder or urethra during urination. Older women and men are more likely to be tired, shaky, and weak and have muscle aches and abdominal pain. A fever may mean the infection is in your kidneys. Other symptoms of a kidney infection include pain in your back or sides below the ribs, nausea, and vomiting. DIAGNOSIS To diagnose a UTI, your caregiver will ask you about your symptoms. Your caregiver will also ask you to provide a urine sample. The urine sample will be tested for bacteria and white blood cells. White blood cells are made by your body to help fight infection. TREATMENT  Typically, UTIs can be treated with medication. Because most UTIs are caused by a bacterial infection, they usually can be treated with the use of antibiotics. The choice of antibiotic and length of treatment depend on your symptoms and the type of bacteria causing your infection. HOME CARE INSTRUCTIONS  If you were prescribed antibiotics, take them exactly as your caregiver instructs you. Finish the medication even if you feel better  after you have only taken some of the medication.  Drink enough water and fluids to keep your urine clear or pale yellow.  Avoid caffeine, tea, and carbonated beverages. They tend to irritate your bladder.  Empty your bladder often. Avoid holding urine for long periods of time.  Empty your bladder before and after sexual intercourse.  After a bowel movement, women should cleanse from front to back. Use each tissue only once. SEEK MEDICAL CARE IF:   You have back pain.  You develop a fever.  Your symptoms do not begin to resolve within 3 days. SEEK IMMEDIATE MEDICAL CARE IF:   You have severe back pain or lower abdominal pain.  You develop chills.  You have nausea or vomiting.  You have continued burning or discomfort with urination. MAKE SURE YOU:   Understand these instructions.  Will watch your condition.  Will get help right away if you are not doing well or get worse.   This information is not intended to replace advice given to you by your health care provider. Make sure you discuss any questions you have with your health care provider.   Document Released: 12/02/2004 Document Revised: 11/13/2014 Document Reviewed: 04/02/2011 Elsevier Interactive Patient Education 2016 Elsevier Inc[employer]    IF you received an x-ray today, you will receive an invoice from [facility]. Please contact [facility] at [phone] with questions or concerns regarding your invoice.   IF you received labwork today, you will receive an invoice from [facility]. Please contact [facility] at [phone] with questions or concerns regarding your invoice.     Our billing staff will not be able to assist you with questions regarding bills from these companies.  You will be contacted with the lab results as soon as they are available. The fastest way to get your results is to activate your My Chart account. Instructions are located on the last page of this  paperwork. If you have not heard from us regarding the results in 2 weeks, please contact this office.     

## 2015-06-25 NOTE — Progress Notes (Signed)
    MRN: [id number] DOB: 10/24/1980  Subjective:   Zoe Ryan is a 35 y.o. female presenting for chief complaint of Urinary Frequency and Abdominal Pain  Reports 1 day history of dysuria, urinary frequency and pelvic pain. Has not tried any medications for relief. Of note, patient was seen by another practice 1 month ago and treated for pyelonephritis. Denies fever, hematuria, flank pain, abdominal pain, cloudy malordorous urine, genital rash, genital irritation and vaginal discharge, nausea and vomiting.   Zoe Ryan has a current medication list which includes the following prescription(s): alprazolam, bupropion, etonogestrel-ethinyl estradiol, fluticasone, multivitamin with minerals, sertraline hcl, and UNABLE TO FIND. Patient has No Known Allergies.  Zoe Ryan  has a past medical history of Depression; Anxiety; Chronic shoulder pain; and History of tobacco use. Also  has past surgical history that includes Arthroscopic repair ACL.  Objective:   Vitals: BP 108/60 mmHg  Pulse 93  Temp(Src) 97.9 F (36.6 C) (Oral)  Resp 18  Ht 5' 4.5" (1.638 m)  Wt 146 lb (66.225 kg)  BMI 24.68 kg/m2  SpO2 98%  LMP 06/09/2015  Physical Exam  Constitutional: She is oriented to person, place, and time. She appears well-developed and well-nourished.  Cardiovascular: Normal rate, regular rhythm and intact distal pulses.  Exam reveals no gallop and no friction rub.   No murmur heard. Pulmonary/Chest: No respiratory distress. She has no wheezes. She has no rales.  Abdominal: Soft. Bowel sounds are normal. She exhibits no distension and no mass. There is tenderness (pelvic).  Neurological: She is alert and oriented to person, place, and time.  Skin: Skin is warm and dry.   Results for orders placed or performed in visit on 06/25/15 (from the past 24 hour(s))  POCT Microscopic Urinalysis ([facility])     Status: Abnormal   Collection Time: 06/25/15  6:38 PM  Result Value Ref Range   WBC,UR,HPF,POC Too numerous to  count  (A) None WBC/hpf   RBC,UR,HPF,POC Moderate (A) None RBC/hpf   Bacteria Moderate (A) None, Too numerous to count   Mucus Absent Absent   Epithelial Cells, UR Per Microscopy Moderate (A) None, Too numerous to count cells/hpf  POCT urinalysis dipstick     Status: Abnormal   Collection Time: 06/25/15  6:38 PM  Result Value Ref Range   Color, UA yellow yellow   Clarity, UA clear clear   Glucose, UA negative negative   Bilirubin, UA negative negative   Ketones, POC UA negative negative   Spec Grav, UA 1.020    Blood, UA moderate (A) negative   pH, UA 5.5    Protein Ur, POC =30 (A) negative   Urobilinogen, UA 0.2    Nitrite, UA Negative Negative   Leukocytes, UA moderate (2+) (A) Negative   Assessment and Plan :   1. Cystitis 2. Frequent urination 3. Dysuria 4. Pelvic pain in female - Patient agreed to ciprofloxacin. Reviewed potential for interactions with Wellbutrin XL. Urine culture is pending. RTC in 1 week if no improvement.  Wallis BambergMario Esiah Bazinet, PA-C [facility] [phone] 06/25/2015 6:23 PM

## 2015-06-28 LAB — URINE CULTURE

## 2015-07-08 ENCOUNTER — Ambulatory Visit (INDEPENDENT_AMBULATORY_CARE_PROVIDER_SITE_OTHER): Payer: PRIVATE HEALTH INSURANCE | Admitting: Physician Assistant

## 2015-07-08 ENCOUNTER — Encounter: Payer: Self-pay | Admitting: Physician Assistant

## 2015-07-08 VITALS — BP 108/62 | HR 70 | Temp 98.7°F | Resp 16 | Ht 64.5 in | Wt 140.6 lb

## 2015-07-08 DIAGNOSIS — Z309 Encounter for contraceptive management, unspecified: Secondary | ICD-10-CM

## 2015-07-08 DIAGNOSIS — Z1329 Encounter for screening for other suspected endocrine disorder: Secondary | ICD-10-CM

## 2015-07-08 DIAGNOSIS — M25511 Pain in right shoulder: Secondary | ICD-10-CM | POA: Diagnosis not present

## 2015-07-08 DIAGNOSIS — Z13220 Encounter for screening for lipoid disorders: Secondary | ICD-10-CM

## 2015-07-08 DIAGNOSIS — J310 Chronic rhinitis: Secondary | ICD-10-CM | POA: Insufficient documentation

## 2015-07-08 DIAGNOSIS — R319 Hematuria, unspecified: Secondary | ICD-10-CM | POA: Diagnosis not present

## 2015-07-08 DIAGNOSIS — F419 Anxiety disorder, unspecified: Secondary | ICD-10-CM

## 2015-07-08 DIAGNOSIS — Z13228 Encounter for screening for other metabolic disorders: Secondary | ICD-10-CM | POA: Diagnosis not present

## 2015-07-08 DIAGNOSIS — Z0000 Encounter for general adult medical examination without abnormal findings: Secondary | ICD-10-CM

## 2015-07-08 DIAGNOSIS — Z114 Encounter for screening for human immunodeficiency virus [HIV]: Secondary | ICD-10-CM | POA: Diagnosis not present

## 2015-07-08 DIAGNOSIS — E559 Vitamin D deficiency, unspecified: Secondary | ICD-10-CM | POA: Insufficient documentation

## 2015-07-08 DIAGNOSIS — Z789 Other specified health status: Secondary | ICD-10-CM

## 2015-07-08 DIAGNOSIS — F329 Major depressive disorder, single episode, unspecified: Secondary | ICD-10-CM | POA: Insufficient documentation

## 2015-07-08 DIAGNOSIS — Z130 Encounter for screening for diseases of the blood and blood-forming organs and certain disorders involving the immune mechanism: Secondary | ICD-10-CM | POA: Diagnosis not present

## 2015-07-08 DIAGNOSIS — G8929 Other chronic pain: Secondary | ICD-10-CM

## 2015-07-08 DIAGNOSIS — Z3009 Encounter for other general counseling and advice on contraception: Secondary | ICD-10-CM | POA: Diagnosis not present

## 2015-07-08 DIAGNOSIS — K529 Noninfective gastroenteritis and colitis, unspecified: Secondary | ICD-10-CM | POA: Insufficient documentation

## 2015-07-08 DIAGNOSIS — G4726 Circadian rhythm sleep disorder, shift work type: Secondary | ICD-10-CM | POA: Insufficient documentation

## 2015-07-08 LAB — POCT URINALYSIS DIP (MANUAL ENTRY)
Bilirubin, UA: NEGATIVE
Glucose, UA: NEGATIVE
Nitrite, UA: NEGATIVE
PH UA: 5.5
PROTEIN UA: NEGATIVE
Spec Grav, UA: 1.025
UROBILINOGEN UA: 0.2

## 2015-07-08 LAB — CBC WITH DIFFERENTIAL/PLATELET
BASOS ABS: 64 {cells}/uL (ref 0–200)
Basophils Relative: 1 %
Eosinophils Absolute: 0 cells/uL — ABNORMAL LOW (ref 15–500)
Eosinophils Relative: 0 %
HEMATOCRIT: 41.3 % (ref 35.0–45.0)
HEMOGLOBIN: 14.2 g/dL (ref 11.7–15.5)
LYMPHS ABS: 2304 {cells}/uL (ref 850–3900)
LYMPHS PCT: 36 %
MCH: 32.4 pg (ref 27.0–33.0)
MCHC: 34.4 g/dL (ref 32.0–36.0)
MCV: 94.3 fL (ref 80.0–100.0)
MONO ABS: 384 {cells}/uL (ref 200–950)
MPV: 9 fL (ref 7.5–12.5)
Monocytes Relative: 6 %
NEUTROS PCT: 57 %
Neutro Abs: 3648 cells/uL (ref 1500–7800)
Platelets: 342 10*3/uL (ref 140–400)
RBC: 4.38 MIL/uL (ref 3.80–5.10)
RDW: 13.2 % (ref 11.0–15.0)
WBC: 6.4 10*3/uL (ref 3.8–10.8)

## 2015-07-08 LAB — POC MICROSCOPIC URINALYSIS (UMFC): Mucus: ABSENT

## 2015-07-08 LAB — LIPID PANEL
CHOL/HDL RATIO: 1.8 ratio (ref <=5.0)
Cholesterol: 186 mg/dL (ref 125–200)
HDL: 104 mg/dL (ref >=46)
LDL CALC: 66 mg/dL (ref <130)
TRIGLYCERIDES: 80 mg/dL (ref <150)
VLDL: 16 mg/dL (ref <30)

## 2015-07-08 LAB — COMPREHENSIVE METABOLIC PANEL
ALBUMIN: 4.3 g/dL (ref 3.6–5.1)
ALT: 17 U/L (ref 6–29)
AST: 18 U/L (ref 10–30)
Alkaline Phosphatase: 58 U/L (ref 33–115)
BILIRUBIN TOTAL: 0.3 mg/dL (ref 0.2–1.2)
BUN: 9 mg/dL (ref 7–25)
CALCIUM: 9 mg/dL (ref 8.6–10.2)
CHLORIDE: 103 mmol/L (ref 98–110)
CO2: 23 mmol/L (ref 20–31)
Creat: 0.66 mg/dL (ref 0.50–1.10)
GLUCOSE: 76 mg/dL (ref 65–99)
POTASSIUM: 4.2 mmol/L (ref 3.5–5.3)
Sodium: 137 mmol/L (ref 135–146)
Total Protein: 7.6 g/dL (ref 6.1–8.1)

## 2015-07-08 LAB — TSH: TSH: 1.57 m[IU]/L

## 2015-07-08 MED ORDER — FLUTICASONE PROPIONATE 50 MCG/ACT NA SUSP: 2.0000 | Freq: Every day | NASAL | Status: DC

## 2015-07-08 MED ORDER — ETONOGESTREL-ETHINYL ESTRADIOL 0.12-0.015 MG/24HR VA RING: VAGINAL_RING | VAGINAL | Status: DC

## 2015-07-08 NOTE — Progress Notes (Signed)
Subjective:    Patient ID: Zoe Ryan, female    DOB: June 11, 1980, 35 y.o.   MRN: [id number]  HPI Presents for Annual Wellness visit and prescription refills.  Cervical Cancer Screening: 02/2012, cytology negative, HPV negative. No history of abnormal pap. Next due 02/2017. Breast Cancer Screening: monthly SBE, annual CBE. Not a candidate for mammography. Colorectal Cancer Screening: not yet a candidate Bone Density Testing: not yet a candidate by age, but has Vit D deficiency, so is at higher risk. Takes Vit D and Calcium. HIV Screening: not yet. Will do today. STI Screening: very low risk. Seasonal Influenza Vaccination: current. Td/Tdap Vaccination: current, 12/2010. Pneumococcal Vaccination: not yet a candidate. Zoster Vaccination: not yet a candidate. Frequency of Dental evaluation: Q6 months Frequency of Eye evaluation: regularly   Prior to Admission medications   Medication Sig Start Date End Date Taking? Authorizing Provider  ALPRAZolam Prudy Feeler) 0.5 MG tablet Take 0.5 mg by mouth at bedtime as needed. Take 0.25 mg to 0.5 mg at bedtime as needed for anxiety    Yes Historical Provider, MD  Armodafinil (NUVIGIL) 250 MG tablet Take 250 mg by mouth.   Yes Historical Provider, MD  budesonide (ENTOCORT EC) 3 MG 24 hr capsule Take 6 mg by mouth. 04/14/15  Yes Historical Provider, MD  buPROPion (WELLBUTRIN XL) 150 MG 24 hr tablet Take 450 mg by mouth daily.    Yes Historical Provider, MD  escitalopram (LEXAPRO) 20 MG tablet  06/07/15  Yes Historical Provider, MD  etonogestrel-ethinyl estradiol (NUVARING) 0.12-0.015 MG/24HR vaginal ring Insert 1 ring vaginally once a month for 3 wks, then remove for 1 wk. 07/08/15  Yes Darice Vicario, PA-C  fluticasone (FLONASE) 50 MCG/ACT nasal spray Place 2 sprays into both nostrils daily. 07/08/15  Yes Zaydee Aina, PA-C  Multiple Vitamin (MULITIVITAMIN WITH MINERALS) TABS Take 1 tablet by mouth daily.     Yes Historical Provider, MD  VITAMIN D,  ERGOCALCIFEROL, PO Take by mouth.   Yes Historical Provider, MD  zaleplon (SONATA) 10 MG capsule TAKE 1 CAPSULE BY MOUTH DAILY AT BEDTIME AS NEEDED FOR INSOMNIA 07/02/15  Yes Historical Provider, MD  fluconazole (DIFLUCAN) 150 MG tablet Take 1 tablet (150 mg total) by mouth once. Repeat in 1 week if needed. Patient not taking: Reported on 07/08/2015 06/25/15   Wallis Bamberg, PA-C     Patient Active Problem List   Diagnosis Date Noted  . Anxiety and depression 07/08/2015  . Rhinitis 07/08/2015  . Colitis 07/08/2015  . 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 Vitamin D deficiency 07/08/2015  . Uses birth control 07/08/2015    Past Medical History  Diagnosis Date  . Depression   . Anxiety   . Chronic shoulder pain   . History of tobacco use     Quit 2013    Past Surgical History  Procedure Laterality Date  . Arthroscopic repair acl      Family History  Problem Relation Age of Onset  . Anxiety disorder Maternal Grandmother   . Mental retardation Maternal Grandmother   . Congestive Heart Failure Maternal Grandfather   . Cancer Maternal Grandfather     lymphoma  . Stroke Paternal Grandfather   . Mental illness Brother     depression  . Hyperlipidemia Brother     Social History   Social History  . Marital Status: Married    Spouse Name: [name]  . Number of Children: 0  . Years of Education: College   Occupational History  . lab  tech    Social History Main Topics  . Smoking status: Former Games developermoker  . Smokeless tobacco: Never Used  . Alcohol Use: 6.6 oz/week    11 Cans of beer per week  . Drug Use: No  . Sexual Activity:    Partners: Male     Comment: NuvaRing   Other Topics Concern  . Not on file   Social History Narrative   Lives with her husband.     Education: College   Exercise: Yes     Review of Systems  Constitutional: Negative.   HENT: Negative.   Eyes: Negative.   Respiratory: Negative.   Cardiovascular: Negative.   Gastrointestinal: Negative.     Endocrine: Negative.   Genitourinary: Negative.   Musculoskeletal: Negative.   Skin: Negative.   Allergic/Immunologic: Negative.   Neurological: Negative.   Hematological: Negative.   Psychiatric/Behavioral: Negative.        Objective:   Physical Exam  Constitutional: She is oriented to person, place, and time. Vital signs are normal. She appears well-developed and well-nourished. She is active and cooperative. No distress.  BP 108/62 mmHg  Pulse 70  Temp(Src) 98.7 F (37.1 C) (Oral)  Resp 16  Ht 5' 4.5" (1.638 m)  Wt 140 lb 9.6 oz (63.776 kg)  BMI 23.77 kg/m2  SpO2 97%  LMP 06/09/2015   HENT:  Head: Normocephalic and atraumatic.  Right Ear: Hearing, tympanic membrane, external ear and ear canal normal. No foreign bodies.  Left Ear: Hearing, tympanic membrane, external ear and ear canal normal. No foreign bodies.  Nose: Nose normal.  Mouth/Throat: Uvula is midline, oropharynx is clear and moist and mucous membranes are normal. No oral lesions. Normal dentition. No dental abscesses or uvula swelling. No oropharyngeal exudate.  Eyes: Conjunctivae, EOM and lids are normal. Pupils are equal, round, and reactive to light. Right eye exhibits no discharge. Left eye exhibits no discharge. No scleral icterus.  Fundoscopic exam:      The right eye shows no arteriolar narrowing, no AV nicking, no exudate, no hemorrhage and no papilledema. The right eye shows red reflex.       The left eye shows no arteriolar narrowing, no AV nicking, no exudate, no hemorrhage and no papilledema. The left eye shows red reflex.  Neck: Trachea normal, normal range of motion and full passive range of motion without pain. Neck supple. No spinous process tenderness and no muscular tenderness present. No thyroid mass and no thyromegaly present.  Cardiovascular: Normal rate, regular rhythm, normal heart sounds, intact distal pulses and normal pulses.   Pulmonary/Chest: Effort normal and breath sounds normal.  Right breast exhibits no inverted nipple, no mass, no nipple discharge, no skin change and no tenderness. Left breast exhibits no inverted nipple, no mass, no nipple discharge, no skin change and no tenderness. Breasts are symmetrical.  Musculoskeletal: She exhibits no edema or tenderness.       Cervical back: Normal.       Thoracic back: Normal.       Lumbar back: Normal.  Lymphadenopathy:       Head (right side): No tonsillar, no preauricular, no posterior auricular and no occipital adenopathy present.       Head (left side): No tonsillar, no preauricular, no posterior auricular and no occipital adenopathy present.    She has no cervical adenopathy.       Right: No supraclavicular adenopathy present.       Left: No supraclavicular adenopathy present.  Neurological: She is alert and  oriented to person, place, and time. She has normal strength and normal reflexes. No cranial nerve deficit. She exhibits normal muscle tone. Coordination and gait normal.  Skin: Skin is warm, dry and intact. No rash noted. She is not diaphoretic. No cyanosis or erythema. Nails show no clubbing.  Psychiatric: She has a normal mood and affect. Her speech is normal and behavior is normal. Judgment and thought content normal.           Assessment & Plan:  1. Annual physical exam Age appropriate anticipatory guidance provided.  2. Screening for HIV (human immunodeficiency virus) - HIV antibody  3. Screening for hyperlipidemia - Lipid panel  4. Screening for metabolic disorder - Comprehensive metabolic panel  5. Screening for thyroid disorder - TSH  6. Vitamin D deficiency Continue vit D supplementation, Calcium supplementation, weight-bearing exercise and sensible sun exposure. - VITAMIN D 25 Hydroxy (Vit-D Deficiency, Fractures)  7. Uses birth control 10. Encounter for other general counseling or advice on contraception Happy with current method. Continue. - etonogestrel-ethinyl estradiol  (NUVARING) 0.12-0.015 MG/24HR vaginal ring; Insert 1 ring vaginally once a month for 3 wks, then remove for 1 wk.  Dispense: 3 each; Refill: 3  8. Hematuria Recent UTI. Verify resolution. - POCT urinalysis dipstick - POCT Microscopic Urinalysis ([facility])  9. Screening for deficiency anemia - CBC with Differential/Platelet  11. Non-allergic rhinitis Stable. - fluticasone (FLONASE) 50 MCG/ACT nasal spray; Place 2 sprays into both nostrils daily.  Dispense: 48 g; Refill: 3   Return in about 1 year (around 07/07/2016).   Fernande Bras, PA-C Physician Assistant-Certified [facility]

## 2015-07-08 NOTE — Patient Instructions (Addendum)
   IF you received an x-ray today, you will receive an invoice from [facility]. Please contact [facility] at [phone] with questions or concerns regarding your invoice.   IF you received labwork today, you will receive an invoice from [facility]. Please contact [facility] at [phone] with questions or concerns regarding your invoice.   Our billing staff will not be able to assist you with questions regarding bills from these companies.  You will be contacted with the lab results as soon as they are available. The fastest way to get your results is to activate your My Chart account. Instructions are located on the last page of this paperwork. If you have not heard from us regarding the results in 2 weeks, please contact this office.     Keeping You Healthy  Get These Tests 1. Blood Pressure- Have your blood pressure checked once a year by your health care provider.  Normal blood pressure is 120/80. 2. Weight- Have your body mass index (BMI) calculated to screen for obesity.  BMI is measure of body fat based on height and weight.  You can also calculate your own BMI at www.nhlbisupport.com/bmi/. 3. Cholesterol- Have your cholesterol checked every 5 years starting at age 20 then yearly starting at age 45. 4. Chlamydia, HIV, and other sexually transmitted diseases- Get screened every year until age 25, then within three months of each new sexual provider. 5. Pap Test - Every 1-5 years; discuss with your health care provider. 6. Mammogram- Every 1-2 years starting at age 40--50  Take these medicines  Calcium with Vitamin D-Your body needs 1200 mg of Calcium each day and 800-1000 IU of Vitamin D daily.  Your body can only absorb 500 mg of Calcium at a time so Calcium must be taken in 2 or 3 divided doses throughout the day.  Multivitamin with folic acid- Once daily if it is possible for you to become pregnant.  Get these  Immunizations  Gardasil-Series of three doses; prevents HPV related illness such as genital warts and cervical cancer.  Menactra-Single dose; prevents meningitis.  Tetanus shot- Every 10 years.  Flu shot-Every year.  Take these steps 1. Do not smoke-Your healthcare provider can help you quit.  For tips on how to quit go to www.smokefree.gov or call 1-800 QUITNOW. 2. Be physically active- Exercise 5 days a week for at least 30 minutes.  If you are not already physically active, start slow and gradually work up to 30 minutes of moderate physical activity.  Examples of moderate activity include walking briskly, dancing, swimming, bicycling, etc. 3. Breast Cancer- A self breast exam every month is important for early detection of breast cancer.  For more information and instruction on self breast exams, ask your healthcare provider or www.womenshealth.gov/faq/breast-self-exam.cfm. 4. Eat a healthy diet- Eat a variety of healthy foods such as fruits, vegetables, whole grains, low fat milk, low fat cheeses, yogurt, lean meats, poultry and fish, beans, nuts, tofu, etc.  For more information go to www. Thenutritionsource.org 5. Drink alcohol in moderation- Limit alcohol intake to one drink or less per day. Never drink and drive. 6. Depression- Your emotional health is as important as your physical health.  If you're feeling down or losing interest in things you normally enjoy please talk to your healthcare provider about being screened for depression. 7. Dental visit- Brush and floss your teeth twice daily; visit your dentist twice a year. 8. Eye doctor- Get an eye exam at least every   2 years. 9. Helmet use- Always wear a helmet when riding a bicycle, motorcycle, rollerblading or skateboarding. 10. Safe sex- If you may be exposed to sexually transmitted infections, use a condom. 11. Seat belts- Seat belts can save your live; always wear one. 12. Smoke/Carbon Monoxide detectors- These detectors need to  be installed on the appropriate level of your home. Replace batteries at least once a year. 13. Skin cancer- When out in the sun please cover up and use sunscreen 15 SPF or higher. 14. Violence- If anyone is threatening or hurting you, please tell your healthcare provider.        

## 2015-07-09 LAB — VITAMIN D 25 HYDROXY (VIT D DEFICIENCY, FRACTURES): Vit D, 25-Hydroxy: 46 ng/mL (ref 30–100)

## 2015-07-09 LAB — HIV ANTIBODY (ROUTINE TESTING W REFLEX): HIV 1&2 Ab, 4th Generation: NONREACTIVE

## 2016-02-21 ENCOUNTER — Ambulatory Visit (INDEPENDENT_AMBULATORY_CARE_PROVIDER_SITE_OTHER): Payer: PRIVATE HEALTH INSURANCE | Admitting: Family Medicine

## 2016-02-21 ENCOUNTER — Ambulatory Visit (INDEPENDENT_AMBULATORY_CARE_PROVIDER_SITE_OTHER): Payer: PRIVATE HEALTH INSURANCE

## 2016-02-21 VITALS — BP 114/68 | HR 96 | Temp 98.2°F | Resp 16 | Ht 64.5 in | Wt 151.0 lb

## 2016-02-21 DIAGNOSIS — N926 Irregular menstruation, unspecified: Secondary | ICD-10-CM | POA: Diagnosis not present

## 2016-02-21 DIAGNOSIS — M545 Low back pain: Secondary | ICD-10-CM

## 2016-02-21 LAB — POCT URINE PREGNANCY: PREG TEST UR: NEGATIVE

## 2016-02-21 MED ORDER — TRAMADOL HCL 50 MG PO TABS: 50.0000 mg | ORAL_TABLET | Freq: Three times a day (TID) | ORAL | 0 refills | Status: DC | PRN

## 2016-02-21 MED ORDER — IBUPROFEN 600 MG PO TABS: 600.0000 mg | ORAL_TABLET | Freq: Three times a day (TID) | ORAL | 0 refills | Status: DC | PRN

## 2016-02-21 MED ORDER — CYCLOBENZAPRINE HCL 10 MG PO TABS: 10.0000 mg | ORAL_TABLET | Freq: Two times a day (BID) | ORAL | 0 refills | Status: DC | PRN

## 2016-02-21 NOTE — Progress Notes (Signed)
Zoe Ryan is a 35 y.o. female who presents to [facility] today for back pain:  1.  Back pain:  Started a week ago. She was coming down her front door steps and slipped on the ice. Fell on middle of her back on the concrete steps. Has had pain since then. She went to work on Monday but missed Tuesday secondary to the pain. She has large bruise on her back. She has been taking 60 mg ibuprofen without much relief. No radiated down her legs. No numbness and weakness. No bladder or bowel incontinence.  She's not had trouble with her back previously.  ROS as above.    PMH reviewed. Patient is a nonsmoker.   Past Medical History:  Diagnosis Date  . Anxiety   . Chronic shoulder pain   . Depression   . History of tobacco use    Quit 2013   Past Surgical History:  Procedure Laterality Date  . ARTHROSCOPIC REPAIR ACL      Medications reviewed. Current Outpatient Prescriptions  Medication Sig Dispense Refill  . ALPRAZolam (XANAX) 0.5 MG tablet Take 0.5 mg by mouth at bedtime as needed. Take 0.25 mg to 0.5 mg at bedtime as needed for anxiety     . Armodafinil (NUVIGIL) 250 MG tablet Take 250 mg by mouth.    Marland Kitchen. buPROPion (WELLBUTRIN XL) 150 MG 24 hr tablet Take 450 mg by mouth daily.     Marland Kitchen. escitalopram (LEXAPRO) 20 MG tablet   1  . etonogestrel-ethinyl estradiol (NUVARING) 0.12-0.015 MG/24HR vaginal ring Insert 1 ring vaginally once a month for 3 wks, then remove for 1 wk. 3 each 3  . fluticasone (FLONASE) 50 MCG/ACT nasal spray Place 2 sprays into both nostrils daily. 48 g 3  . Multiple Vitamin (MULITIVITAMIN WITH MINERALS) TABS Take 1 tablet by mouth daily.      Marland Kitchen. VITAMIN D, ERGOCALCIFEROL, PO Take by mouth.    . zaleplon (SONATA) 10 MG capsule TAKE 1 CAPSULE BY MOUTH DAILY AT BEDTIME AS NEEDED FOR INSOMNIA  1  . budesonide (ENTOCORT EC) 3 MG 24 hr capsule Take 6 mg by mouth.    . fluconazole (DIFLUCAN) 150 MG tablet Take 1 tablet (150 mg total) by mouth once. Repeat  in 1 week if needed. (Patient not taking: Reported on 02/21/2016) 2 tablet 0   No current facility-administered medications for this visit.      Physical Exam:  BP 114/68   Pulse 96   Temp 98.2 F (36.8 C) (Oral)   Resp 16   Ht 5' 4.5" (1.638 m)   Wt 151 lb (68.5 kg)   SpO2 96%   BMI 25.52 kg/m  Gen:  Alert, cooperative patient who appears stated age in no acute distress.  Vital signs reviewed. HEENT: EOMI,  MMM Pulm:  Clear to auscultation bilaterally with good air movement.  No wheezes or rales noted.   Cardiac:  Regular rate and rhythm without murmur auscultated.  Good S1/S2. Back:  Normal skin, Spine with normal alignment and no deformity.  No tenderness to vertebral process palpation.  Paraspinous muscles are tender and with spasm essentially entire thoracic and lumbar region.   Range of motion is full at neck but decreased forward flexion umbar sacral regions.  Straight leg raise is positive on RIght Neuro:  Sensation and motor function 5/5 bilateral lower extremities.  Patellar and Achilles  DTR's +2 patellar BL.  He is able to walk on his heels and toes  without difficulty.  Skin:  5 cm healing and barely visible bruise across lumbar region of back.    Results for orders placed or performed in visit on 02/21/16  POCT urine pregnancy  Result Value Ref Range   Preg Test, Ur Negative Negative    Assessment and Plan:  1.  Lumbar strain.   - Radiographs of thoracic and lumbar region were negative today for any fracture. -We'll treat as lumbar/thoracic strain. -Tramadol for severe pain relief. I did discuss risk of lowered seizure disorder with patient she is on both bupropion and low dose Lexapro. She states she has been treated with tramadol several times in the past for severe pain and she has tolerated this well. She has no history of seizure disorder. - Ice and heat.  She can start with massage once bruising gone.   Consider Physical Therapy and XRay studies if not  improving.  Call or return to clinic prn if these symptoms worsen or fail to improve as anticipated.  Return immediately if worsening.    We did check a pregnancy test prior to her xrays; this was negative.  She is a little late for her period this month.

## 2016-02-21 NOTE — Patient Instructions (Addendum)
Your x-rays are completely negative. There is no evidence of any fracture or break in the bone.  You do have a bad muscle spasm in your lower back.  This is causing the pain you are feeling.  Take the Ibuprofen 600 mg every 8 hours or so for pain relief and anti-inflammatory effects.  Take the Flexeril as a muscle relaxer at night. This may make you drowsy and if it does do not take it during the day.  Heat and massage are also great to help relieve the pain.  This can last for the next 1-2 weeks. If you're still having issues in the next 2 weeks come back and see us.  If you start having worsening pain despite the treatment don't wait and come back immediately.     IF you received an x-ray today, you will receive an [facility]. Please contact [facility] at [phone] with questions or concerns regarding your invoice.   IF you received labwork today, you will receive an invoice from [facility]. Please contact [facility] at [phone] with questions or concerns regarding your invoice.   Our billing staff will not be able to assist you with questions regarding bills from these companies.  You will be contacted with the lab results as soon as they are available. The fastest way to get your results is to activate your My Chart account. Instructions are located on the last page of this paperwork. If you have not heard from us regarding the results in 2 weeks, please contact this office.

## 2016-12-28 ENCOUNTER — Encounter: Payer: PRIVATE HEALTH INSURANCE | Admitting: Physician Assistant

## 2016-12-28 NOTE — Progress Notes (Deleted)
    Patient ID: Zoe Ryan, female    DOB: 11/09/1980, 36 y.o.   MRN: [id number]  PCP: Patient, No Pcp Per  No chief complaint on file.   Subjective:   Presents for Zoe Ryan.  *** Cervical Cancer Screening: 02/2012, cytology negative, HPV negative. No history of abnormal pap. Next due 02/2017. Breast Cancer Screening: monthly SBE, annual CBE. Not a candidate for mammography. Colorectal Cancer Screening: not yet a candidate Bone Density Testing: not yet a candidate by age, but has Vit D deficiency, so is at higher risk. Takes Vit D and Calcium. HIV Screening: 02/2016 NEGATIVE STI Screening: very low risk. Seasonal Influenza Vaccination: *** Td/Tdap Vaccination: current, 12/2010. Pneumococcal Vaccination: not yet a candidate. Zoster Vaccination: not yet a candidate. Frequency of Dental evaluation: Q6 months Frequency of Eye evaluation: regularly   Patient Active Problem List   Diagnosis Date Noted  . Anxiety and depression 07/08/2015  . 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 Colitis 07/08/2015  . 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 Vitamin D deficiency 07/08/2015  . Uses birth control 07/08/2015  . Chronic right shoulder pain 07/08/2015    Past Medical History:  Diagnosis Date  . Anxiety   . Chronic shoulder pain   . Depression   . History of tobacco use    Quit 2013     ***  No Known Allergies  Past Surgical History:  Procedure Laterality Date  . ARTHROSCOPIC REPAIR ACL      Family History  Problem Relation Age of Onset  . Anxiety disorder Maternal Grandmother   . Mental retardation Maternal Grandmother   . Congestive Heart Failure Maternal Grandfather   . Cancer Maternal Grandfather        lymphoma  . Stroke Paternal Grandfather   . Mental illness Brother        depression  . Hyperlipidemia Brother     Social History   Social History  . Marital status: Married    Spouse name: Ronaldo MiyamotoKyle  . Number of children: 0  . Years of education:  College   Occupational History  . lab tech [employer]   Social History Main Topics  . Smoking status: Former Games developermoker  . Smokeless tobacco: Never Used  . Alcohol use 6.6 oz/week    11 Cans of beer per week  . Drug use: No  . Sexual activity: Yes    Partners: Male     Comment: NuvaRing   Other Topics Concern  . Not on file   Social History Narrative   Lives with her husband.     Education: College   Exercise: Yes       Review of Systems      Objective:  Physical Exam         Assessment & Plan:  ***

## 2017-01-04 ENCOUNTER — Ambulatory Visit (INDEPENDENT_AMBULATORY_CARE_PROVIDER_SITE_OTHER): Payer: PRIVATE HEALTH INSURANCE | Admitting: Physician Assistant

## 2017-01-04 ENCOUNTER — Encounter: Payer: Self-pay | Admitting: Physician Assistant

## 2017-01-04 VITALS — BP 108/80 | HR 96 | Temp 98.8°F | Resp 18 | Ht 64.5 in | Wt 164.8 lb

## 2017-01-04 DIAGNOSIS — Z0000 Encounter for general adult medical examination without abnormal findings: Secondary | ICD-10-CM

## 2017-01-04 DIAGNOSIS — Z124 Encounter for screening for malignant neoplasm of cervix: Secondary | ICD-10-CM

## 2017-01-04 DIAGNOSIS — E559 Vitamin D deficiency, unspecified: Secondary | ICD-10-CM | POA: Diagnosis not present

## 2017-01-04 DIAGNOSIS — Z3009 Encounter for other general counseling and advice on contraception: Secondary | ICD-10-CM | POA: Diagnosis not present

## 2017-01-04 DIAGNOSIS — Z13228 Encounter for screening for other metabolic disorders: Secondary | ICD-10-CM

## 2017-01-04 DIAGNOSIS — Z13220 Encounter for screening for lipoid disorders: Secondary | ICD-10-CM | POA: Diagnosis not present

## 2017-01-04 DIAGNOSIS — Z309 Encounter for contraceptive management, unspecified: Secondary | ICD-10-CM

## 2017-01-04 DIAGNOSIS — Z130 Encounter for screening for diseases of the blood and blood-forming organs and certain disorders involving the immune mechanism: Secondary | ICD-10-CM

## 2017-01-04 DIAGNOSIS — Z1389 Encounter for screening for other disorder: Secondary | ICD-10-CM | POA: Diagnosis not present

## 2017-01-04 DIAGNOSIS — H02401 Unspecified ptosis of right eyelid: Secondary | ICD-10-CM | POA: Diagnosis not present

## 2017-01-04 DIAGNOSIS — Z789 Other specified health status: Secondary | ICD-10-CM

## 2017-01-04 MED ORDER — ETONOGESTREL-ETHINYL ESTRADIOL 0.12-0.015 MG/24HR VA RING: VAGINAL_RING | VAGINAL | 3 refills | Status: DC

## 2017-01-04 NOTE — Patient Instructions (Signed)
   IF you received an x-ray today, you will receive an invoice from [facility]. Please contact  Radiology at [phone] with questions or concerns regarding your invoice.   IF you received labwork today, you will receive an invoice from [facility]. Please contact [facility] at [phone] with questions or concerns regarding your invoice.   Our billing staff will not be able to assist you with questions regarding bills from these companies.  You will be contacted with the lab results as soon as they are available. The fastest way to get your results is to activate your My Chart account. Instructions are located on the last page of this paperwork. If you have not heard from us regarding the results in 2 weeks, please contact this office.     Preventive Care 18-39 Years, Female Preventive care refers to lifestyle choices and visits with your health care provider that can promote health and wellness. What does preventive care include?  A yearly physical exam. This is also called an annual well check.  Dental exams once or twice a year.  Routine eye exams. Ask your health care provider how often you should have your eyes checked.  Personal lifestyle choices, including: ? Daily care of your teeth and gums. ? Regular physical activity. ? Eating a healthy diet. ? Avoiding tobacco and drug use. ? Limiting alcohol use. ? Practicing safe sex. ? Taking vitamin and mineral supplements as recommended by your health care provider. What happens during an annual well check? The services and screenings done by your health care provider during your annual well check will depend on your age, overall health, lifestyle risk factors, and family history of disease. Counseling Your health care provider may ask you questions about your:  Alcohol use.  Tobacco use.  Drug use.  Emotional well-being.  Home and relationship well-being.  Sexual activity.  Eating habits.  Work  and work environment.  Method of birth control.  Menstrual cycle.  Pregnancy history.  Screening You may have the following tests or measurements:  Height, weight, and BMI.  Diabetes screening. This is done by checking your blood sugar (glucose) after you have not eaten for a while (fasting).  Blood pressure.  Lipid and cholesterol levels. These may be checked every 5 years starting at age 20.  Skin check.  Hepatitis C blood test.  Hepatitis B blood test.  Sexually transmitted disease (STD) testing.  BRCA-related cancer screening. This may be done if you have a family history of breast, ovarian, tubal, or peritoneal cancers.  Pelvic exam and Pap test. This may be done every 3 years starting at age 21. Starting at age 30, this may be done every 5 years if you have a Pap test in combination with an HPV test.  Discuss your test results, treatment options, and if necessary, the need for more tests with your health care provider. Vaccines Your health care provider may recommend certain vaccines, such as:  Influenza vaccine. This is recommended every year.  Tetanus, diphtheria, and acellular pertussis (Tdap, Td) vaccine. You may need a Td booster every 10 years.  Varicella vaccine. You may need this if you have not been vaccinated.  HPV vaccine. If you are 26 or younger, you may need three doses over 6 months.  Measles, mumps, and rubella (MMR) vaccine. You may need at least one dose of MMR. You may also need a second dose.  Pneumococcal 13-valent conjugate (PCV13) vaccine. You may need this if you have certain conditions and   were not previously vaccinated.  Pneumococcal polysaccharide (PPSV23) vaccine. You may need one or two doses if you smoke cigarettes or if you have certain conditions.  Meningococcal vaccine. One dose is recommended if you are age 19-21 years and a first-year college student living in a residence hall, or if you have one of several medical conditions.  You may also need additional booster doses.  Hepatitis A vaccine. You may need this if you have certain conditions or if you travel or work in places where you may be exposed to hepatitis A.  Hepatitis B vaccine. You may need this if you have certain conditions or if you travel or work in places where you may be exposed to hepatitis B.  Haemophilus influenzae type b (Hib) vaccine. You may need this if you have certain risk factors.  Talk to your health care provider about which screenings and vaccines you need and how often you need them. This information is not intended to replace advice given to you by your health care provider. Make sure you discuss any questions you have with your health care provider. Document Released: 04/20/2001 Document Revised: 11/12/2015 Document Reviewed: 12/24/2014 Elsevier Interactive Patient Education  2017 [employer]  

## 2017-01-04 NOTE — Progress Notes (Signed)
Subjective:    Patient ID: Zoe Ryan, female    DOB: 12/15/1980, 36 y.o.   MRN: [id number]  HPI  Zoe Ryan is a 36 year old Caucasian female with a past medical history significant for anxiety and depression, colitis, and shift work sleep disorder who presents today for her annual complete physical exam. Patient also presents with a chief complaint of a right eyelid that is drooping. Patient states she first noticed the right eye lid drooping 3 months ago, but looking back in pictures she has had it for about 1 year. Zoe Ryan states her right eye is twitching as well. She reports it is not affecting her vision. She denies redness and is unsure if it seems swollen. Zoe Ryan states her last optometry appointment with Dr. April Mansonellers was 1 week ago. The ice test at that time was negative and patient states she believes Dr. April Mansonellers wanted to have her thyroid checked today.   Zoe Ryan states she is currently eating a low carb diet. She is eating a lot of protein, eggs, and nuts. She will also have some sandwiches, vegetables and salads. She uses her exercise bike 30 minutes every day at home. Zoe Ryan states she will drink 3 beers a day.  Zoe Ryan smoked 1/2 pack of cigarettes for 10 years and now smokes e-cigarettes for 2 years.   Zoe Ryan states she is currently going through a divorce. Zoe Ryan has been legally separated since June 2018. She has good friends to support her and has been seeing a therapist since July. She feels comfortable seeing her therapist. Zoe Ryan states in the past month she has been experiencing decreased interest in doing things she usually enjoys. Zoe Ryan states she is currently compliant with her medications and is not experiencing any side effects.  Her medications are managed by Dr. Jennelle Humanottle, her psychiatrist. Zoe Ryan denies any thoughts of harming herself or others.   Cervical Cancer Screening: last pap smear was 03/03/12 HIV Screening:  07/08/15 Immunizations: Up to date   Optometrist: Dr. April Mansonellers  Dentist: Dr. Freeman CaldronAdornetto, last visit was 3 months ago  Therapist: Kirt BoysMolly  Psychiatrist: Dr. Jennelle Humanottle   Medications:  Prior to Admission medications   Medication Sig Start Date End Date Taking? Authorizing Provider  ALPRAZolam Prudy Feeler(XANAX) 0.5 MG tablet Take 0.5 mg by mouth at bedtime as needed. Take 0.25 mg to 0.5 mg at bedtime as needed for anxiety    Yes [provider]  Armodafinil (NUVIGIL) 250 MG tablet Take 250 mg by mouth.   Yes [provider]  buPROPion (WELLBUTRIN XL) 150 MG 24 hr tablet Take 450 mg by mouth daily.    Yes [provider]  escitalopram (LEXAPRO) 20 MG tablet  06/07/15  Yes [provider]  etonogestrel-ethinyl estradiol (NUVARING) 0.12-0.015 MG/24HR vaginal ring Insert 1 ring vaginally once a month for 3 wks, then remove for 1 wk. 07/08/15  Yes Jeffery, Chelle, PA-C  fluticasone (FLONASE) 50 MCG/ACT nasal spray Place 2 sprays into both nostrils daily. 07/08/15  Yes Jeffery, Chelle, PA-C  ibuprofen (ADVIL,MOTRIN) 600 MG tablet Take 1 tablet (600 mg total) by mouth every 8 (eight) hours as needed. 02/21/16  Yes Tobey GrimWalden, Jeffrey H, MD  Multiple Vitamin (MULITIVITAMIN WITH MINERALS) TABS Take 1 tablet by mouth daily.     Yes [provider]  VITAMIN D, ERGOCALCIFEROL, PO Take by mouth.   Yes [provider]  zaleplon (SONATA) 10 MG capsule TAKE 1 CAPSULE BY MOUTH DAILY AT  BEDTIME AS NEEDED FOR INSOMNIA 07/02/15  Yes [provider]  budesonide (ENTOCORT EC) 3 MG 24 hr capsule Take 6 mg by mouth. 04/14/15   [provider]  cyclobenzaprine (FLEXERIL) 10 MG tablet Take 1 tablet (10 mg total) by mouth 2 (two) times daily as needed for muscle spasms. Patient not taking: Reported on 01/04/2017 02/21/16   Tobey Grim, MD  fluconazole (DIFLUCAN) 150 MG tablet Take 1 tablet (150 mg total) by mouth once. Repeat in 1 week if needed. Patient not taking: Reported  on 02/21/2016 06/25/15   Wallis Bamberg, PA-C  traMADol (ULTRAM) 50 MG tablet Take 1 tablet (50 mg total) by mouth every 8 (eight) hours as needed. Patient not taking: Reported on 01/04/2017 02/21/16   Tobey Grim, MD   Allergies: No Known Allergies  Chronic Medical Conditions: Patient Active Problem List   Diagnosis Date Noted  . Anxiety and depression 07/08/2015  . 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 Colitis 07/08/2015  . 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 Vitamin D deficiency 07/08/2015  . Uses birth control 07/08/2015  . Chronic right shoulder pain 07/08/2015   Surgical History: Past Surgical History:  Procedure Laterality Date  . ARTHROSCOPIC REPAIR ACL      Family History:  Family History  Problem Relation Age of Onset  . Anxiety disorder Maternal Grandmother   . Mental retardation Maternal Grandmother   . Congestive Heart Failure Maternal Grandfather   . Cancer Maternal Grandfather        lymphoma  . Stroke Paternal Grandfather   . Mental illness Brother        depression  . Hyperlipidemia Brother    Review of Systems     Objective:   Physical Exam  Constitutional: She appears well-developed and well-nourished. She is active and cooperative.  BP 108/80 (BP Location: Left Arm, Patient Position: Sitting, Cuff Size: Normal)   Pulse 96   Temp 98.8 F (37.1 C) (Oral)   Resp 18   Ht 5' 4.5" (1.638 m)   Wt 164 lb 12.8 oz (74.8 kg)   SpO2 97%   BMI 27.85 kg/m    HENT:  Head: Normocephalic and atraumatic.  Right Ear: Tympanic membrane, external ear and ear canal normal. Tympanic membrane is not erythematous and not bulging. No middle ear effusion.  Left Ear: Tympanic membrane, external ear and ear canal normal. Tympanic membrane is not erythematous and not bulging.  No middle ear effusion.  Nose: Nose normal. No rhinorrhea or sinus tenderness.  Mouth/Throat: Uvula is midline. No oral lesions. Normal dentition. No oropharyngeal exudate or posterior  oropharyngeal erythema.  Eyes: Pupils are equal, round, and reactive to light. Conjunctivae and EOM are normal. Right eye exhibits normal extraocular motion. Left eye exhibits normal extraocular motion.    Neck: Normal range of motion. Neck supple. No thyromegaly present.  Cardiovascular: Normal rate, regular rhythm, S1 normal, S2 normal, normal heart sounds, intact distal pulses and normal pulses.   Pulses:      Radial pulses are 2+ on the right side, and 2+ on the left side.       Dorsalis pedis pulses are 2+ on the right side, and 2+ on the left side.       Posterior tibial pulses are 2+ on the right side, and 2+ on the left side.  Pulmonary/Chest: Effort normal and breath sounds normal. She has no wheezes.  Abdominal: Soft. Bowel sounds are normal. She exhibits no distension. There is no tenderness.  Genitourinary:  Uterus normal. No breast swelling, tenderness, discharge or bleeding. There is no rash, tenderness or lesion on the right labia. There is no rash, tenderness or lesion on the left labia. Cervix exhibits friability. Cervix exhibits no motion tenderness. Right adnexum displays no mass, no tenderness and no fullness. Left adnexum displays no mass, no tenderness and no fullness. There is bleeding in the vagina. No erythema in the vagina. Vaginal discharge found.  Musculoskeletal: Normal range of motion.  Strength 5/5 in all extremities  Lymphadenopathy:    She has no cervical adenopathy.  Neurological: She is alert. No cranial nerve deficit.  Reflex Scores:      Patellar reflexes are 2+ on the right side and 2+ on the left side.      Achilles reflexes are 2+ on the right side and 2+ on the left side. Skin: Skin is warm and dry.     Assessment & Plan:  1. Annual physical exam - UA dipstick obtained today in clinic - CMP obtained today in clinic - Lipid panel obtained today in clinic - CBC with diff obtained today in clinic   2. Drooping eyelid, right - TSH obtained today in  clinic - Once results are received, plan to refer to a Neurologist at [facility]   3. Screening for cervical cancer - Pap IG and HPV (high risk) DNA detection obtained today in clinic  4. Encounter for other general counseling or advice on contraception - Provided 3 refills for Nuvaring today  - Counseled patient on decreased efficacy of Nuvaring when using Armodafinil, but patient would like to continue using Nuvaring knowing the decreased efficacy.  Judie Petit, PA-S

## 2017-01-04 NOTE — Progress Notes (Signed)
Patient ID: Zoe Ryan, female    DOB: [birth year], 36 y.o.   MRN: [id number]  PCP: Porfirio Oar, PA-C  Chief Complaint  Patient presents with  . Annual Exam    With Pap  . Depression    Depression scale score 17  . Medication Refill    Nuvaring, Flonase   Patient Care Team: Porfirio Oar, PA-C as PCP - General (Family Medicine) Chilton Si Garret Reddish, MD (Gastroenterology) Cottle, Steva Ready., MD as Attending Physician (Psychiatry)   Subjective:   Presents for Annual Wellness Visit.  In general, she is doing really well, but she reports drooping of the RIGHT eyelid, accompanied by twitching of the eyelid. She can see progression over the past several years by looking at photos of herself. She saw her eye specialist last week, and mentioned it. She was encouraged to have her thyroid checked.  She is currently in the process of divorce, with legal separation in June. She has good social support and sees a therapist and psychiatrist.   Cervical Cancer Screening: 03/03/2012. Breast Cancer Screening: annual CBE, not yet a candidate for screening mammography. Colorectal Cancer Screening: not yet a candidate Bone Density Testing: not yet a candidate HIV Screening: NEGATIVE 07/08/2015 Seasonal Influenza Vaccination: 12/08/2016 Td/Tdap Vaccination: 12/16/2010 Pneumococcal Vaccination: not yet a candidate Zoster Vaccination: not yet a candidate Frequency of Dental evaluation: Q6 months Frequency of Eye evaluation: annually   Patient Active Problem List   Diagnosis Date Noted  . Anxiety and depression 07/08/2015  . 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 Colitis 07/08/2015  . 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 Vitamin D deficiency 07/08/2015  . Uses birth control 07/08/2015  . Chronic right shoulder pain 07/08/2015    Past Medical History:  Diagnosis Date  . Anxiety   . Chronic shoulder pain   . Depression   . History of tobacco use    Quit 2013     Prior to  Admission medications   Medication Sig Start Date End Date Taking? Authorizing Provider  ALPRAZolam Prudy Feeler) 0.5 MG tablet Take 0.5 mg by mouth at bedtime as needed. Take 0.25 mg to 0.5 mg at bedtime as needed for anxiety    Yes [provider]  Armodafinil (NUVIGIL) 250 MG tablet Take 250 mg by mouth.   Yes [provider]  buPROPion (WELLBUTRIN XL) 150 MG 24 hr tablet Take 450 mg by mouth daily.    Yes [provider]  escitalopram (LEXAPRO) 20 MG tablet  06/07/15  Yes [provider]  etonogestrel-ethinyl estradiol (NUVARING) 0.12-0.015 MG/24HR vaginal ring Insert 1 ring vaginally once a month for 3 wks, then remove for 1 wk. 07/08/15  Yes Priscella Donna, PA-C  fluticasone (FLONASE) 50 MCG/ACT nasal spray Place 2 sprays into both nostrils daily. 07/08/15  Yes Caira Poche, PA-C  ibuprofen (ADVIL,MOTRIN) 600 MG tablet Take 1 tablet (600 mg total) by mouth every 8 (eight) hours as needed. 02/21/16  Yes Tobey Grim, MD  Multiple Vitamin (MULITIVITAMIN WITH MINERALS) TABS Take 1 tablet by mouth daily.     Yes [provider]  VITAMIN D, ERGOCALCIFEROL, PO Take by mouth.   Yes [provider]  zaleplon (SONATA) 10 MG capsule TAKE 1 CAPSULE BY MOUTH DAILY AT BEDTIME AS NEEDED FOR INSOMNIA 07/02/15  Yes [provider]  budesonide (ENTOCORT EC) 3 MG 24 hr capsule Take 6 mg by mouth. 04/14/15   [provider]  cyclobenzaprine (FLEXERIL) 10 MG tablet Take 1 tablet (10 mg  total) by mouth 2 (two) times daily as needed for muscle spasms. Patient not taking: Reported on 01/04/2017 02/21/16   Tobey Grim, MD  fluconazole (DIFLUCAN) 150 MG tablet Take 1 tablet (150 mg total) by mouth once. Repeat in 1 week if needed. Patient not taking: Reported on 02/21/2016 06/25/15   Wallis Bamberg, PA-C  traMADol (ULTRAM) 50 MG tablet Take 1 tablet (50 mg total) by mouth every 8 (eight) hours as needed. Patient not taking: Reported on 01/04/2017  02/21/16   Tobey Grim, MD    No Known Allergies  Past Surgical History:  Procedure Laterality Date  . ARTHROSCOPIC REPAIR ACL      Family History  Problem Relation Age of Onset  . Anxiety disorder Maternal Grandmother   . Mental retardation Maternal Grandmother   . Congestive Heart Failure Maternal Grandfather   . Cancer Maternal Grandfather        lymphoma  . Stroke Paternal Grandfather   . Mental illness Brother        depression  . Hyperlipidemia Brother     Social History   Social History  . Marital status: Married    Spouse name: [name]  . Number of children: 0  . Years of education: College   Occupational History  . lab tech [employer]   Social History Main Topics  . Smoking status: Former Games developer  . Smokeless tobacco: Never Used  . Alcohol use 6.6 oz/week    11 Cans of beer per week  . Drug use: No  . Sexual activity: No     Comment: NuvaRing   Other Topics Concern  . None   Social History Narrative   Lives with her husband.     Education: College   Exercise: Yes       Review of Systems  Constitutional: Positive for fatigue. Negative for activity change, appetite change, chills, diaphoresis, fever and unexpected weight change.  HENT: Negative.   Eyes: Negative.   Respiratory: Negative.   Cardiovascular: Negative.   Gastrointestinal: Negative.   Endocrine: Negative.   Genitourinary: Negative.   Musculoskeletal: Negative.   Skin: Negative.   Allergic/Immunologic: Negative.   Neurological: Negative.   Hematological: Negative.   Psychiatric/Behavioral: Positive for dysphoric mood and sleep disturbance. Negative for agitation, behavioral problems, confusion, decreased concentration, hallucinations, self-injury and suicidal ideas. The patient is not nervous/anxious and is not hyperactive.         Objective:  Physical Exam  Constitutional: She is oriented to person, place, and time. Vital signs are normal. She appears  well-developed and well-nourished. She is active and cooperative. No distress.  BP 108/80 (BP Location: Left Arm, Patient Position: Sitting, Cuff Size: Normal)   Pulse 96   Temp 98.8 F (37.1 C) (Oral)   Resp 18   Ht 5' 4.5" (1.638 m)   Wt 164 lb 12.8 oz (74.8 kg)   SpO2 97%   BMI 27.85 kg/m    HENT:  Head: Normocephalic and atraumatic.  Right Ear: Hearing, tympanic membrane, external ear and ear canal normal. No foreign bodies.  Left Ear: Hearing, tympanic membrane, external ear and ear canal normal. No foreign bodies.  Nose: Nose normal.  Mouth/Throat: Uvula is midline, oropharynx is clear and moist and mucous membranes are normal. No oral lesions. Normal dentition. No dental abscesses or uvula swelling. No oropharyngeal exudate.  Eyes: Conjunctivae, EOM and lids are normal. Pupils are equal, round, and reactive to light. Right eye exhibits no discharge. Left eye exhibits no discharge.  No scleral icterus.  Fundoscopic exam:      The right eye shows no arteriolar narrowing, no AV nicking, no exudate, no hemorrhage and no papilledema.       The left eye shows no arteriolar narrowing, no AV nicking, no exudate, no hemorrhage and no papilledema.  Neck: Trachea normal, normal range of motion and full passive range of motion without pain. Neck supple. No spinous process tenderness and no muscular tenderness present. No thyroid mass and no thyromegaly present.  Cardiovascular: Normal rate, regular rhythm, normal heart sounds, intact distal pulses and normal pulses.  Pulmonary/Chest: Effort normal and breath sounds normal. She exhibits no tenderness and no retraction. Right breast exhibits no inverted nipple, no mass, no nipple discharge, no skin change and no tenderness. Left breast exhibits no inverted nipple, no mass, no nipple discharge, no skin change and no tenderness. Breasts are symmetrical.  Abdominal: Soft. Normal appearance and bowel sounds are normal. She exhibits no distension and no  mass. There is no hepatosplenomegaly. There is no tenderness. There is no rigidity, no rebound, no guarding, no CVA tenderness, no tenderness at McBurney's point and negative Murphy's sign. No hernia. Hernia confirmed negative in the right inguinal area and confirmed negative in the left inguinal area.  Genitourinary: Rectum normal, vagina normal and uterus normal. Rectal exam shows no external hemorrhoid and no fissure. No breast swelling, tenderness, discharge or bleeding. Pelvic exam was performed with patient supine. No labial fusion. There is no rash, tenderness, lesion or injury on the right labia. There is no rash, tenderness, lesion or injury on the left labia. Cervix exhibits no motion tenderness, no discharge and no friability. Right adnexum displays no mass, no tenderness and no fullness. Left adnexum displays no mass, no tenderness and no fullness. No erythema, tenderness or bleeding in the vagina. No foreign body in the vagina. No signs of injury around the vagina. No vaginal discharge found.  Musculoskeletal: She exhibits no edema or tenderness.       Cervical back: Normal.       Thoracic back: Normal.       Lumbar back: Normal.  Lymphadenopathy:       Head (right side): No tonsillar, no preauricular, no posterior auricular and no occipital adenopathy present.       Head (left side): No tonsillar, no preauricular, no posterior auricular and no occipital adenopathy present.    She has no cervical adenopathy.    She has no axillary adenopathy.       Right: No inguinal and no supraclavicular adenopathy present.       Left: No inguinal and no supraclavicular adenopathy present.  Neurological: She is alert and oriented to person, place, and time. She has normal strength and normal reflexes. No cranial nerve deficit. She exhibits normal muscle tone. Coordination and gait normal.  Skin: Skin is warm, dry and intact. No rash noted. She is not diaphoretic. No cyanosis or erythema. Nails show no  clubbing.  Psychiatric: She has a normal mood and affect. Her speech is normal and behavior is normal. Judgment and thought content normal.      Visual Acuity Screening   Right eye Left eye Both eyes  Without correction:     With correction: 20/20 20/20 20/20        Assessment & Plan:  1. Annual physical exam Await labs. Adjust regimen as indicated by results.  2. Drooping eyelid, right Await TSH. If normal, plan referral to neurology. - TSH  3. Screening for  cervical cancer If cytology and HPV both negative, repeat co-testing in 5 years. - Pap IG and HPV (high risk) DNA detection  4. Vitamin D deficiency Continue supplementation.  5. Uses birth control Happy with her current method. Continue NuvaRing. - etonogestrel-ethinyl estradiol (NUVARING) 0.12-0.015 MG/24HR vaginal ring; Insert 1 ring vaginally once a month for 3 wks, then remove for 1 wk.  Dispense: 3 each; Refill: 3  6. Screening for blood or protein in urine - Urinalysis, dipstick only  7. Screening for metabolic disorder - Comprehensive metabolic panel  8. Screening for hyperlipidemia - Lipid panel  9. Screening for deficiency anemia - CBC with Differential/Platelet   Return in about 1 year (around 01/04/2018) for wellness exam.   Fernande Brashelle S. Casimer Russett, PA-C Primary Care at [facility]

## 2017-01-05 LAB — COMPREHENSIVE METABOLIC PANEL
ALBUMIN: 3.9 g/dL (ref 3.5–5.5)
ALT: 26 IU/L (ref 0–32)
AST: 22 IU/L (ref 0–40)
Albumin/Globulin Ratio: 1 — ABNORMAL LOW (ref 1.2–2.2)
Alkaline Phosphatase: 99 IU/L (ref 39–117)
BUN / CREAT RATIO: 8 — AB (ref 9–23)
BUN: 6 mg/dL (ref 6–20)
Bilirubin Total: 0.2 mg/dL (ref 0.0–1.2)
CO2: 23 mmol/L (ref 20–29)
CREATININE: 0.71 mg/dL (ref 0.57–1.00)
Calcium: 9.3 mg/dL (ref 8.7–10.2)
Chloride: 98 mmol/L (ref 96–106)
GFR calc non Af Amer: 111 mL/min/{1.73_m2} (ref >59)
GFR, EST AFRICAN AMERICAN: 128 mL/min/{1.73_m2} (ref >59)
Globulin, Total: 4 g/dL (ref 1.5–4.5)
Glucose: 86 mg/dL (ref 65–99)
Potassium: 4 mmol/L (ref 3.5–5.2)
Sodium: 137 mmol/L (ref 134–144)
TOTAL PROTEIN: 7.9 g/dL (ref 6.0–8.5)

## 2017-01-05 LAB — CBC WITH DIFFERENTIAL/PLATELET
BASOS ABS: 0 10*3/uL (ref 0.0–0.2)
Basos: 0 %
EOS (ABSOLUTE): 0 10*3/uL (ref 0.0–0.4)
Eos: 0 %
Hematocrit: 40.9 % (ref 34.0–46.6)
Hemoglobin: 14.3 g/dL (ref 11.1–15.9)
IMMATURE GRANULOCYTES: 0 %
Immature Grans (Abs): 0 10*3/uL (ref 0.0–0.1)
LYMPHS ABS: 3 10*3/uL (ref 0.7–3.1)
Lymphs: 38 %
MCH: 31.9 pg (ref 26.6–33.0)
MCHC: 35 g/dL (ref 31.5–35.7)
MCV: 91 fL (ref 79–97)
MONOS ABS: 0.4 10*3/uL (ref 0.1–0.9)
Monocytes: 5 %
NEUTROS PCT: 57 %
Neutrophils Absolute: 4.3 10*3/uL (ref 1.4–7.0)
PLATELETS: 363 10*3/uL (ref 150–379)
RBC: 4.48 x10E6/uL (ref 3.77–5.28)
RDW: 14.3 % (ref 12.3–15.4)
WBC: 7.7 10*3/uL (ref 3.4–10.8)

## 2017-01-05 LAB — URINALYSIS, DIPSTICK ONLY
BILIRUBIN UA: NEGATIVE
Glucose, UA: NEGATIVE
Ketones, UA: NEGATIVE
NITRITE UA: NEGATIVE
PH UA: 6.5 (ref 5.0–7.5)
Protein, UA: NEGATIVE
SPEC GRAV UA: 1.006 (ref 1.005–1.030)
UUROB: 0.2 mg/dL (ref 0.2–1.0)

## 2017-01-05 LAB — LIPID PANEL
CHOL/HDL RATIO: 2.2 ratio (ref 0.0–4.4)
Cholesterol, Total: 164 mg/dL (ref 100–199)
HDL: 75 mg/dL (ref >39)
LDL CALC: 74 mg/dL (ref 0–99)
Triglycerides: 73 mg/dL (ref 0–149)
VLDL CHOLESTEROL CAL: 15 mg/dL (ref 5–40)

## 2017-01-05 LAB — TSH: TSH: 3.45 u[IU]/mL (ref 0.450–4.500)

## 2017-01-06 LAB — PAP IG AND HPV HIGH-RISK
HPV, HIGH-RISK: POSITIVE — AB
PAP Smear Comment: 0

## 2017-01-17 ENCOUNTER — Encounter: Payer: Self-pay | Admitting: Physician Assistant

## 2017-01-17 DIAGNOSIS — R87610 Atypical squamous cells of undetermined significance on cytologic smear of cervix (ASC-US): Secondary | ICD-10-CM

## 2017-01-17 DIAGNOSIS — R87810 Cervical high risk human papillomavirus (HPV) DNA test positive: Secondary | ICD-10-CM

## 2017-01-17 DIAGNOSIS — H02401 Unspecified ptosis of right eyelid: Secondary | ICD-10-CM

## 2017-01-18 NOTE — Telephone Encounter (Signed)
Discussed results with patient. [facility].

## 2017-01-19 ENCOUNTER — Telehealth: Payer: Self-pay

## 2017-01-19 NOTE — Telephone Encounter (Signed)
Please advise on labs.   Copied from CRM [id number]. Topic: Quick Communication - Lab Results >> Jan 17, 2017  5:17 PM Viviann SpareWhite, Selina wrote: Patient would like a call back about her lab results. Pt has questions about the numbers in her results.

## 2017-01-19 NOTE — Telephone Encounter (Signed)
Spoke with patient by phone yesterday regarding this.

## 2017-01-31 ENCOUNTER — Encounter: Payer: Self-pay | Admitting: Physician Assistant

## 2017-01-31 DIAGNOSIS — H02401 Unspecified ptosis of right eyelid: Secondary | ICD-10-CM

## 2017-02-02 DIAGNOSIS — H02401 Unspecified ptosis of right eyelid: Secondary | ICD-10-CM | POA: Insufficient documentation

## 2017-02-02 NOTE — Telephone Encounter (Signed)
[facility] referral not received. They advised patient to tell us to call.  See her message in My Chart.

## 2017-02-03 NOTE — Telephone Encounter (Signed)
Called [facility] neurology and left a message for their referral dept to call back

## 2017-02-04 NOTE — Telephone Encounter (Signed)
Called [facility] neuro and they stated that pt has an appt with them on dec 28,2018 at 11am called pt and left it on her vm

## 2017-03-10 ENCOUNTER — Ambulatory Visit: Payer: PRIVATE HEALTH INSURANCE | Admitting: Urgent Care

## 2017-03-10 ENCOUNTER — Encounter: Payer: Self-pay | Admitting: Urgent Care

## 2017-03-10 ENCOUNTER — Ambulatory Visit (HOSPITAL_COMMUNITY)
Admission: RE | Admit: 2017-03-10 | Discharge: 2017-03-10 | Disposition: A | Discharge status: Home / Self Care | Payer: PRIVATE HEALTH INSURANCE | Source: Ambulatory Visit | Attending: Urgent Care | Admitting: Urgent Care

## 2017-03-10 VITALS — BP 113/78 | HR 116 | Temp 98.8°F | Resp 18 | Ht 64.5 in | Wt 163.2 lb

## 2017-03-10 DIAGNOSIS — M79662 Pain in left lower leg: Secondary | ICD-10-CM | POA: Insufficient documentation

## 2017-03-10 DIAGNOSIS — I82412 Acute embolism and thrombosis of left femoral vein: Secondary | ICD-10-CM | POA: Insufficient documentation

## 2017-03-10 DIAGNOSIS — M7989 Other specified soft tissue disorders: Secondary | ICD-10-CM | POA: Diagnosis not present

## 2017-03-10 MED ORDER — RIVAROXABAN 15 MG PO TABS: 15.0000 mg | ORAL_TABLET | Freq: Two times a day (BID) | ORAL | 0 refills | Status: DC

## 2017-03-10 NOTE — Progress Notes (Addendum)
   MRN: [id number] DOB: 02/19/1981  Subjective:   Zoe Ryan is a 37 y.o. female presenting for 3 day history of worsening left calf pain, swelling. Patient was in a spin cycle class this Saturday and was pretty uneventful. She had a tattoo done later the same day and started feeling sharp pain of her left calf that night. She has since had swelling, difficulty bearing weight. Has also had a mild cough. Has tried ibuprofen and icing with minimal relief. Denies fever, chest pain, shob, redness of her leg, trauma. Does not smoke cigarettes but uses Vape. Denies recent surgeries, long distance travel.   Zoe Ryan has a current medication list which includes the following prescription(s): alprazolam, aripiprazole, armodafinil, bupropion, escitalopram, etonogestrel-ethinyl estradiol, fluticasone, ibuprofen, multivitamin with minerals, ergocalciferol, and zaleplon. Also has No Known Allergies.  Zoe Ryan  has a past medical history of Anxiety, Chronic shoulder pain, Depression, and History of tobacco use. Also  has a past surgical history that includes Arthroscopic repair ACL.  Objective:   Vitals: BP 113/78   Pulse (!) 116   Temp 98.8 F (37.1 C) (Oral)   Resp 18   Ht 5' 4.5" (1.638 m)   Wt 163 lb 3.2 oz (74 kg)   SpO2 98%   BMI 27.58 kg/m   Physical Exam  Constitutional: She is oriented to person, place, and time. She appears well-developed and well-nourished.  Cardiovascular: Normal rate.  Pulmonary/Chest: Effort normal.  Musculoskeletal:       Right lower leg: She exhibits no tenderness, no bony tenderness, no swelling (calf measures 36.8cm at ~12cm inferior to patella), no edema, no deformity and no laceration.       Left lower leg: She exhibits tenderness (over left calf) and swelling (calf measures 38.8cm at ~12cm inferior to patella). She exhibits no bony tenderness, no deformity and no laceration.  Neurological: She is alert and oriented to person, place, and time.  Skin: Skin is warm and  dry.   Assessment and Plan :   Pain of left calf - Plan: VAS US LOWER EXTREMITY VENOUS (DVT)  Pain and swelling of left lower leg - Plan: VAS US LOWER EXTREMITY VENOUS (DVT)  Will pursue stat U/S to r/o dvt. U/S is pending. Calf strain, Baker's cyst is also on the differential. Discussed this with patient and she is in agreement to pursue U/S. Patient advised to take 1,000mg  Tylenol every 8 hours for pain. Will hold off on NSAID given possibility of dvt.   Wallis BambergMario Tiffine Henigan, PA-C Primary Care at [facility] [phone] 03/10/2017  11:43 AM  No results found.    [phone]

## 2017-03-10 NOTE — Addendum Note (Signed)
Addended by: Wallis BambergMANI, Marvelle Span on: 03/10/2017 04:56 PM   Modules accepted: Orders

## 2017-03-10 NOTE — Patient Instructions (Addendum)
You are scheduled for an ultrasound of your left leg at 4pm at [facility]. Please arrive to the main entrance 15 minutes prior to your scheduled time and they will guide you to the radiology department.     IF you received an x-ray today, you will receive an invoice from [facility]. Please contact [facility] at [phone] with questions or concerns regarding your invoice.   IF you received labwork today, you will receive an invoice from [facility]. Please contact [facility] at [phone] with questions or concerns regarding your invoice.   Our billing staff will not be able to assist you with questions regarding bills from these companies.  You will be contacted with the lab results as soon as they are available. The fastest way to get your results is to activate your My Chart account. Instructions are located on the last page of this paperwork. If you have not heard from us regarding the results in 2 weeks, please contact this office.    ;

## 2017-03-10 NOTE — Progress Notes (Signed)
Left lower extremity venous duplex completed. Positive for an acute DVT noted in the posterior tibial, gastrocnemius, popliteal, and distal femoral veins. No evidence of a superficial thrombosis or Baker's cyst. Toma DeitersVirginia Madie Cahn, RVS 03/10/2017 5:35 PM

## 2017-03-14 ENCOUNTER — Telehealth: Payer: Self-pay | Admitting: Physician Assistant

## 2017-03-14 NOTE — Telephone Encounter (Signed)
CRM for notification. See Telephone encounter for:   03/14/17.  Relation to WJ:XBJYpt:self Call back number:[phone]   Reason for call:  Patient last seen 03/10/16 for left calf pain,patient would like to know if theres any restrictions, please advise

## 2017-03-14 NOTE — Telephone Encounter (Signed)
Pt was seen by Mike,PA on 1/3 and was diagnosed with DVT and started on Eliquis.Pt calling asking about restrictions of of her left leg. Pt has gone back to work today. 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 Pt is still experiencing pain and discoloration to leg. Advised pt that she should elevate her leg and use ice to help with swelling. Pt asking for a return call at [phone].

## 2017-03-15 ENCOUNTER — Other Ambulatory Visit: Payer: Self-pay | Admitting: Urgent Care

## 2017-03-15 MED ORDER — TRAMADOL-ACETAMINOPHEN 37.5-325 MG PO TABS: 1.0000 | ORAL_TABLET | Freq: Four times a day (QID) | ORAL | 0 refills | Status: DC | PRN

## 2017-03-15 NOTE — Telephone Encounter (Signed)
Refer to result note updated 03/15/2017.

## 2017-03-21 ENCOUNTER — Encounter: Payer: Self-pay | Admitting: Urgent Care

## 2017-03-22 ENCOUNTER — Other Ambulatory Visit: Payer: Self-pay

## 2017-03-22 ENCOUNTER — Encounter: Payer: Self-pay | Admitting: Urgent Care

## 2017-03-22 ENCOUNTER — Ambulatory Visit: Payer: PRIVATE HEALTH INSURANCE | Admitting: Urgent Care

## 2017-03-22 VITALS — BP 104/82 | HR 84 | Temp 98.0°F | Resp 16 | Ht 64.96 in | Wt 163.0 lb

## 2017-03-22 DIAGNOSIS — R21 Rash and other nonspecific skin eruption: Secondary | ICD-10-CM

## 2017-03-22 DIAGNOSIS — I82442 Acute embolism and thrombosis of left tibial vein: Secondary | ICD-10-CM

## 2017-03-22 DIAGNOSIS — I82452 Acute embolism and thrombosis of left peroneal vein: Secondary | ICD-10-CM

## 2017-03-22 DIAGNOSIS — I82492 Acute embolism and thrombosis of other specified deep vein of left lower extremity: Secondary | ICD-10-CM | POA: Diagnosis not present

## 2017-03-22 DIAGNOSIS — R208 Other disturbances of skin sensation: Secondary | ICD-10-CM

## 2017-03-22 DIAGNOSIS — I82432 Acute embolism and thrombosis of left popliteal vein: Secondary | ICD-10-CM

## 2017-03-22 DIAGNOSIS — I82412 Acute embolism and thrombosis of left femoral vein: Secondary | ICD-10-CM | POA: Diagnosis not present

## 2017-03-22 DIAGNOSIS — B029 Zoster without complications: Secondary | ICD-10-CM

## 2017-03-22 MED ORDER — RIVAROXABAN 20 MG PO TABS: 20.0000 mg | ORAL_TABLET | Freq: Every day | ORAL | 1 refills | Status: DC

## 2017-03-22 MED ORDER — HYDROCODONE-ACETAMINOPHEN 5-325 MG PO TABS: 1.0000 | ORAL_TABLET | Freq: Four times a day (QID) | ORAL | 0 refills | Status: DC | PRN

## 2017-03-22 MED ORDER — VALACYCLOVIR HCL 1 G PO TABS: 1000.0000 mg | ORAL_TABLET | Freq: Three times a day (TID) | ORAL | 0 refills | Status: DC

## 2017-03-22 NOTE — Progress Notes (Signed)
    MRN: [id number] DOB: 01/20/1981  Subjective:   Zoe Ryan is a 37 y.o. female presenting for follow up on dvt, rash. Reports that she is doing much better with her left leg pain. She is taking Xarelto without any issues. Denies fever, bleeding, bruising, chest pain, shob, wheezing, n/v. She does report blister like rash that started 3 days ago. Rash is painful, wraps around left side of her belly to the back. She is taking Tramadol and APAP with relief of her leg pain but not pain from her rash. She has stopped using Nuva Ring as instructed.  Zoe Ryan has a current medication list which includes the following prescription(s): alprazolam, aripiprazole, armodafinil, bupropion, escitalopram, etonogestrel-ethinyl estradiol, fluticasone, multivitamin with minerals, rivaroxaban, tramadol-acetaminophen, ergocalciferol, and zaleplon. Also has No Known Allergies.  Zoe Ryan  has a past medical history of Anxiety, Chronic shoulder pain, Depression, and History of tobacco use. Also  has a past surgical history that includes Arthroscopic repair ACL.  Objective:   Vitals: BP 104/82   Pulse 84   Temp 98 F (36.7 C) (Oral)   Resp 16   Ht 5' 4.96" (1.65 m)   Wt 163 lb (73.9 kg)   SpO2 98%   BMI 27.16 kg/m   Physical Exam  Constitutional: She is oriented to person, place, and time. She appears well-developed and well-nourished.  Cardiovascular: Normal rate.  Pulmonary/Chest: Effort normal.  Musculoskeletal:       Left upper leg: She exhibits no tenderness, no bony tenderness, no swelling, no edema, no deformity and no laceration.       Left lower leg: She exhibits no tenderness, no bony tenderness, no swelling, no edema, no deformity and no laceration.  Neurological: She is alert and oriented to person, place, and time.  Skin: Rash (cluster of blister like lesions over back extending anteriorly toward abdomen in dermatome pattern as depicted) noted.           Assessment and Plan :   Acute deep  vein thrombosis (DVT) of femoral vein of left lower extremity (HCC)  Acute deep vein thrombosis (DVT) of popliteal vein of left lower extremity (HCC)  Acute deep vein thrombosis (DVT) of left tibial vein (HCC)  Acute deep vein thrombosis (DVT) of left peroneal vein (HCC)  Rash and nonspecific skin eruption  Herpes zoster without complication  Pain of skin  DVT is improved, patient is stable. Provided patient with script for Xarelto 20mg  daily. Will cover for Shingles rash with Valtrex, use hydrocodone for pain. Counseled patient on potential for adverse effects with medications prescribed today, patient verbalized understanding. Discussed low possibility of being a drug rash, return-to-clinic precautions discussed, patient verbalized understanding. Otherwise, follow up in 3 months for recheck on dvt.  Wallis BambergMario Tyriq Moragne, PA-C [facility] [phone] 03/22/2017 1:40 PM

## 2017-03-22 NOTE — Patient Instructions (Addendum)
Shingles Shingles, which is also known as herpes zoster, is an infection that causes a painful skin rash and fluid-filled blisters. Shingles is not related to genital herpes, which is a sexually transmitted infection. Shingles only develops in people who:  Have had chickenpox.  Have received the chickenpox vaccine. (This is rare.)  What are the causes? Shingles is caused by varicella-zoster virus (VZV). This is the same virus that causes chickenpox. After exposure to VZV, the virus stays in the body in an inactive (dormant) state. Shingles develops if the virus reactivates. This can happen many years after the initial exposure to VZV. It is not known what causes this virus to reactivate. What increases the risk? People who have had chickenpox or received the chickenpox vaccine are at risk for shingles. Infection is more common in people who:  Are older than age 50.  Have a weakened defense (immune) system, such as those with HIV, AIDS, or cancer.  Are taking medicines that weaken the immune system, such as transplant medicines.  Are under great stress.  What are the signs or symptoms? Early symptoms of this condition include itching, tingling, and pain in an area on your skin. Pain may be described as burning, stabbing, or throbbing. A few days or weeks after symptoms start, a painful red rash appears, usually on one side of the body in a bandlike or beltlike pattern. The rash eventually turns into fluid-filled blisters that break open, scab over, and dry up in about 2-3 weeks. At any time during the infection, you may also develop:  A fever.  Chills.  A headache.  An upset stomach.  How is this diagnosed? This condition is diagnosed with a skin exam. Sometimes, skin or fluid samples are taken from the blisters before a diagnosis is made. These samples are examined under a microscope or sent to a lab for testing. How is this treated? There is no specific cure for this condition.  Your health care provider will probably prescribe medicines to help you manage pain, recover more quickly, and avoid long-term problems. Medicines may include:  Antiviral drugs.  Anti-inflammatory drugs.  Pain medicines.  If the area involved is on your face, you may be referred to a specialist, such as an eye doctor (ophthalmologist) or an ear, nose, and throat (ENT) doctor to help you avoid eye problems, chronic pain, or disability. Follow these instructions at home: Medicines  Take medicines only as directed by your health care provider.  Apply an anti-itch or numbing cream to the affected area as directed by your health care provider. Blister and Rash Care  Take a cool bath or apply cool compresses to the area of the rash or blisters as directed by your health care provider. This may help with pain and itching.  Keep your rash covered with a loose bandage (dressing). Wear loose-fitting clothing to help ease the pain of material rubbing against the rash.  Keep your rash and blisters clean with mild soap and cool water or as directed by your health care provider.  Check your rash every day for signs of infection. These include redness, swelling, and pain that lasts or increases.  Do not pick your blisters.  Do not scratch your rash. General instructions  Rest as directed by your health care provider.  Keep all follow-up visits as directed by your health care provider. This is important.  Until your blisters scab over, your infection can cause chickenpox in people who have never had it or been vaccinated   against it. To prevent this from happening, avoid contact with other people, especially: ? Babies. ? Pregnant women. ? Children who have eczema. ? Elderly people who have transplants. ? People who have chronic illnesses, such as leukemia or AIDS. Contact a health care provider if:  Your pain is not relieved with prescribed medicines.  Your pain does not get better after  the rash heals.  Your rash looks infected. Signs of infection include redness, swelling, and pain that lasts or increases. Get help right away if:  The rash is on your face or nose.  You have facial pain, pain around your eye area, or loss of feeling on one side of your face.  You have ear pain or you have ringing in your ear.  You have loss of taste.  Your condition gets worse. This information is not intended to replace advice given to you by your health care provider. Make sure you discuss any questions you have with your health care provider. Document Released: 02/22/2005 Document Revised: 10/19/2015 Document Reviewed: 01/03/2014 Elsevier Interactive Patient Education  2018 [employer].    Acetaminophen; Hydrocodone tablets or capsules What is this medicine? ACETAMINOPHEN; HYDROCODONE (a set a MEE noe fen; hye droe KOE done) is a pain reliever. It is used to treat moderate to severe pain. This medicine may be used for other purposes; ask your health care provider or pharmacist if you have questions. COMMON BRAND NAME(S): Anexsia, Bancap HC, Ceta-Plus, Co-Gesic, Comfortpak, Dolagesic, [place], [address]/[employer], [address], Hydrogesic, [place], Lorcet HD, Lorcet Plus, Lortab, Margesic H, Maxidone, [place], Polygesic, [place], [place], Retail [employer], Vicodin, Vicodin ES, Vicodin HP, [place] Baseman What should I tell my health care provider before I take this medicine? They need to know if you have any of these conditions: -brain tumor -Crohn's disease, inflammatory bowel disease, or ulcerative colitis -drug abuse or addiction -head injury -heart or circulation problems -if you often drink alcohol -kidney disease or problems going to the bathroom -liver disease -lung disease, asthma, or breathing problems -an unusual or allergic reaction to acetaminophen, hydrocodone, other opioid analgesics, other medicines, foods, dyes, or preservatives -pregnant or trying to get  pregnant -breast-feeding How should I use this medicine? Take this medicine by mouth with a glass of water. Follow the directions on the prescription label. You can take it with or without food. If it upsets your stomach, take it with food. Do not take your medicine more often than directed. A special MedGuide will be given to you by the pharmacist with each prescription and refill. Be sure to read this information carefully each time. Talk to your pediatrician regarding the use of this medicine in children. Special care may be needed. Overdosage: If you think you have taken too much of this medicine contact a poison control center or emergency room at once. NOTE: This medicine is only for you. Do not share this medicine with others. What if I miss a dose? If you miss a dose, take it as soon as you can. If it is almost time for your next dose, take only that dose. Do not take double or extra doses. What may interact with this medicine? This medicine may interact with the following medications: -alcohol -antiviral medicines for HIV or AIDS -atropine -antihistamines for allergy, cough and cold -certain antibiotics like erythromycin, clarithromycin -certain medicines for anxiety or sleep -certain medicines for bladder problems like oxybutynin, tolterodine -certain medicines for depression like amitriptyline, fluoxetine, sertraline -certain medicines for fungal infections like ketoconazole and itraconazole -certain medicines  for Parkinson's disease like benztropine, trihexyphenidyl -certain medicines for seizures like carbamazepine, phenobarbital, phenytoin, primidone -certain medicines for stomach problems like dicyclomine, hyoscyamine -certain medicines for travel sickness like scopolamine -general anesthetics like halothane, isoflurane, methoxyflurane, propofol -ipratropium -local anesthetics like lidocaine, pramoxine, tetracaine -MAOIs like Carbex, Eldepryl, Marplan, Nardil, and  Parnate -medicines that relax muscles for surgery -other medicines with acetaminophen -other narcotic medicines for pain or cough -phenothiazines like chlorpromazine, mesoridazine, prochlorperazine, thioridazine -rifampin This list may not describe all possible interactions. Give your health care provider a list of all the medicines, herbs, non-prescription drugs, or dietary supplements you use. Also tell them if you smoke, drink alcohol, or use illegal drugs. Some items may interact with your medicine. What should I watch for while using this medicine? Tell your doctor or health care professional if your pain does not go away, if it gets worse, or if you have new or a different type of pain. You may develop tolerance to the medicine. Tolerance means that you will need a higher dose of the medicine for pain relief. Tolerance is normal and is expected if you take the medicine for a long time. Do not suddenly stop taking your medicine because you may develop a severe reaction. Your body becomes used to the medicine. This does NOT mean you are addicted. Addiction is a behavior related to getting and using a drug for a non-medical reason. If you have pain, you have a medical reason to take pain medicine. Your doctor will tell you how much medicine to take. If your doctor wants you to stop the medicine, the dose will be slowly lowered over time to avoid any side effects. There are different types of narcotic medicines (opiates). If you take more than one type at the same time or if you are taking another medicine that also causes drowsiness, you may have more side effects. Give your health care provider a list of all medicines you use. Your doctor will tell you how much medicine to take. Do not take more medicine than directed. Call emergency for help if you have problems breathing or unusual sleepiness. Do not take other medicines that contain acetaminophen with this medicine. Always read labels carefully. If  you have questions, ask your doctor or pharmacist. If you take too much acetaminophen get medical help right away. Too much acetaminophen can be very dangerous and cause liver damage. Even if you do not have symptoms, it is important to get help right away. You may get drowsy or dizzy. Do not drive, use machinery, or do anything that needs mental alertness until you know how this medicine affects you. Do not stand or sit up quickly, especially if you are an older patient. This reduces the risk of dizzy or fainting spells. Alcohol may interfere with the effect of this medicine. Avoid alcoholic drinks. The medicine will cause constipation. Try to have a bowel movement at least every 2 to 3 days. If you do not have a bowel movement for 3 days, call your doctor or health care professional. Your mouth may get dry. Chewing sugarless gum or sucking hard candy, and drinking plenty of water may help. Contact your doctor if the problem does not go away or is severe. What side effects may I notice from receiving this medicine? Side effects that you should report to your doctor or health care professional as soon as possible: -allergic reactions like skin rash, itching or hives, swelling of the face, lips, or tongue -breathing problems -confusion -redness,  blistering, peeling or loosening of the skin, including inside the mouth -signs and symptoms of low blood pressure like dizziness; feeling faint or lightheaded, falls; unusually weak or tired -trouble passing urine or change in the amount of urine -yellowing of the eyes or skin Side effects that usually do not require medical attention (report to your doctor or health care professional if they continue or are bothersome): -constipation -dry mouth -nausea, vomiting -tiredness This list may not describe all possible side effects. Call your doctor for medical advice about side effects. You may report side effects to FDA at 1-800-FDA-1088. Where should I keep  my medicine? Keep out of the reach of children. This medicine can be abused. Keep your medicine in a safe place to protect it from theft. Do not share this medicine with anyone. Selling or giving away this medicine is dangerous and against the law. This medicine may cause accidental overdose and death if it taken by other adults, children, or pets. Mix any unused medicine with a substance like cat litter or coffee grounds. Then throw the medicine away in a sealed container like a sealed bag or a coffee can with a lid. Do not use the medicine after the expiration date. Store at room temperature between 15 and 30 degrees C (59 and 86 degrees F). NOTE: This sheet is a summary. It may not cover all possible information. If you have questions about this medicine, talk to your doctor, pharmacist, or health care provider.  2018 Elsevier/Gold Standard (2014-11-15 10:02:16)     IF you received an x-ray today, you will receive an invoice from [facility]. Please contact [facility] at [phone] with questions or concerns regarding your invoice.   IF you received labwork today, you will receive an invoice from [place] [facility]. Please contact [facility] at [phone] with questions or concerns regarding your invoice.   Our billing staff will not be able to assist you with questions regarding bills from these companies.  You will be contacted with the lab results as soon as they are available. The fastest way to get your results is to activate your My Chart account. Instructions are located on the last page of this paperwork. If you have not heard from [place] regarding the results in 2 weeks, please contact this office.

## 2017-03-22 NOTE — Telephone Encounter (Signed)
Patient seen in clinic today

## 2017-03-23 ENCOUNTER — Encounter: Payer: Self-pay | Admitting: Urgent Care

## 2017-03-24 ENCOUNTER — Ambulatory Visit: Payer: PRIVATE HEALTH INSURANCE | Admitting: Urgent Care

## 2017-04-23 IMAGING — DX DG THORACIC SPINE 2V
2 series · 2 of 2 positions shown · non-contrast
Comparison: None.

CLINICAL DATA: Fall directly onto back 1 week ago. Thoracic back
pain. Initial encounter.

EXAM:
THORACIC SPINE 2 VIEWS

[t-spine ap]
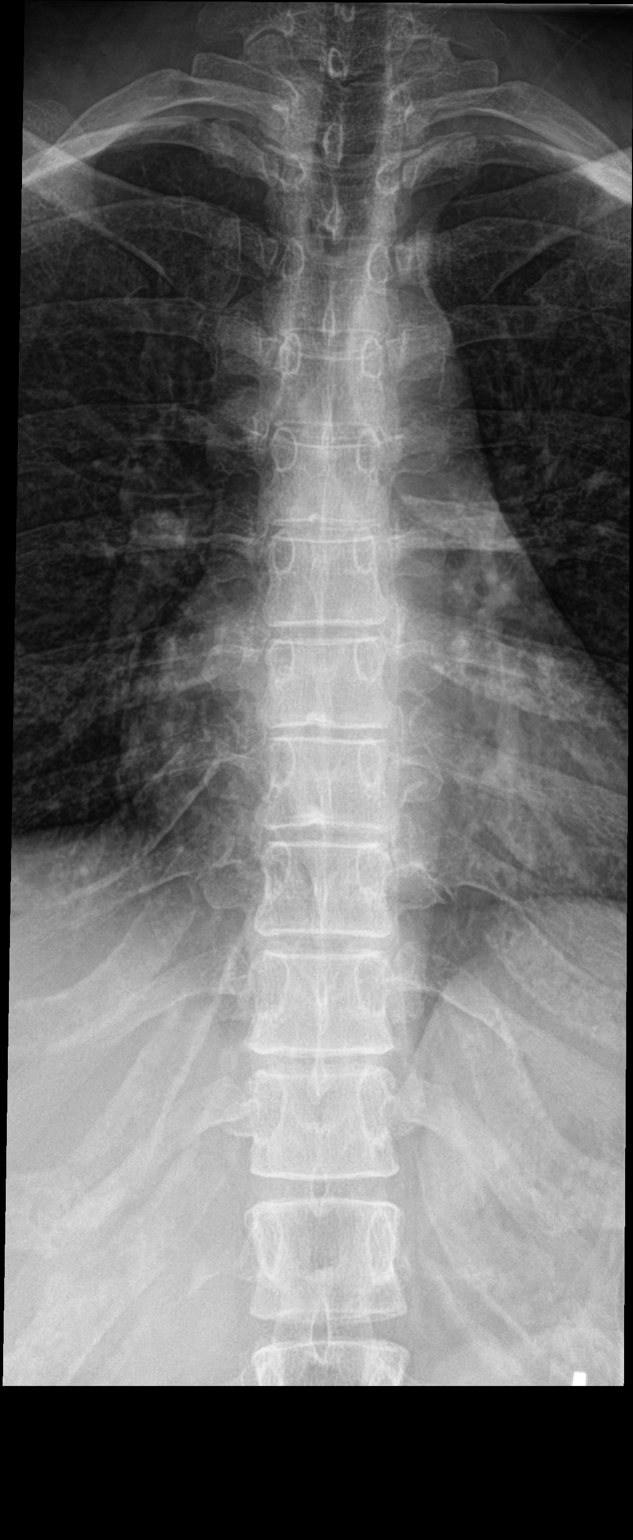

[t-spine lat]
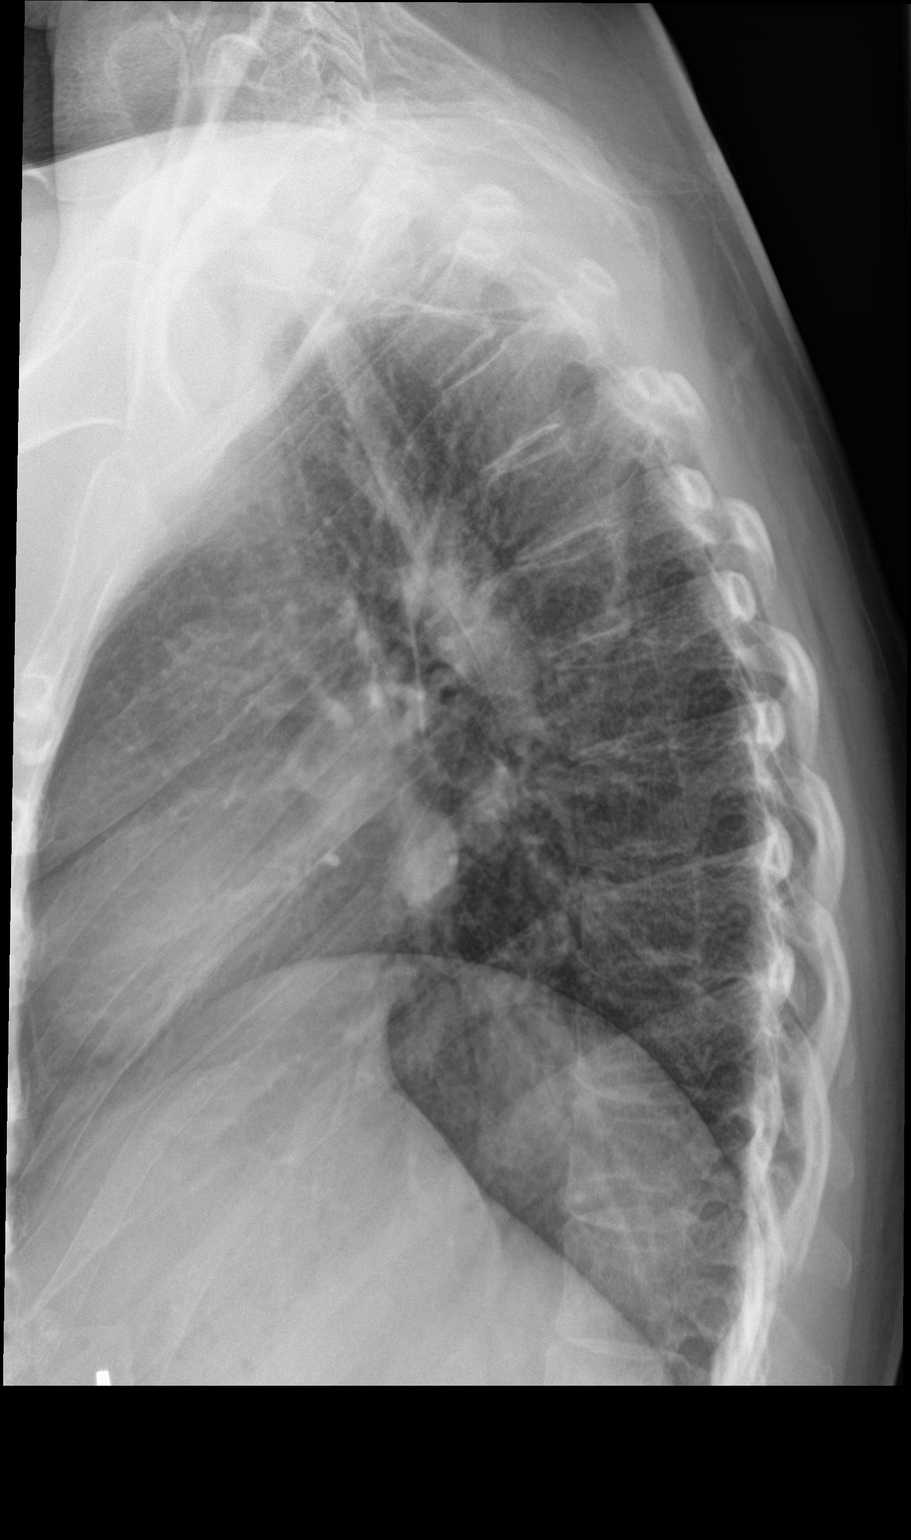

[2 of 2 positions shown; findings below may reference images not displayed]

FINDINGS: There is no evidence of thoracic spine fracture. Alignment is
normal. No other significant bone abnormalities are identified.
IMPRESSION: Negative.

## 2017-04-23 IMAGING — DX DG LUMBAR SPINE COMPLETE 4+V
5 series · 5 of 5 positions shown · non-contrast
Comparison: None.

CLINICAL DATA: Fall onto back 1 week ago. Acute midline low back
pain. Initial encounter.

EXAM:
LUMBAR SPINE - COMPLETE 4+ VIEW

[l-spine ap]
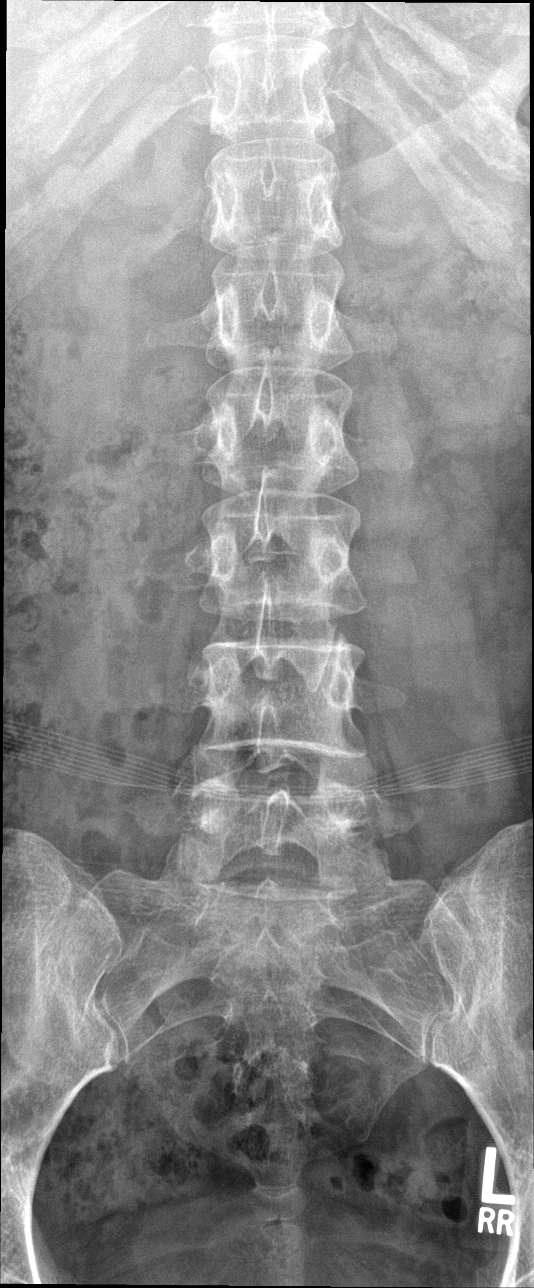

[l-spine obl (1 of 2)]
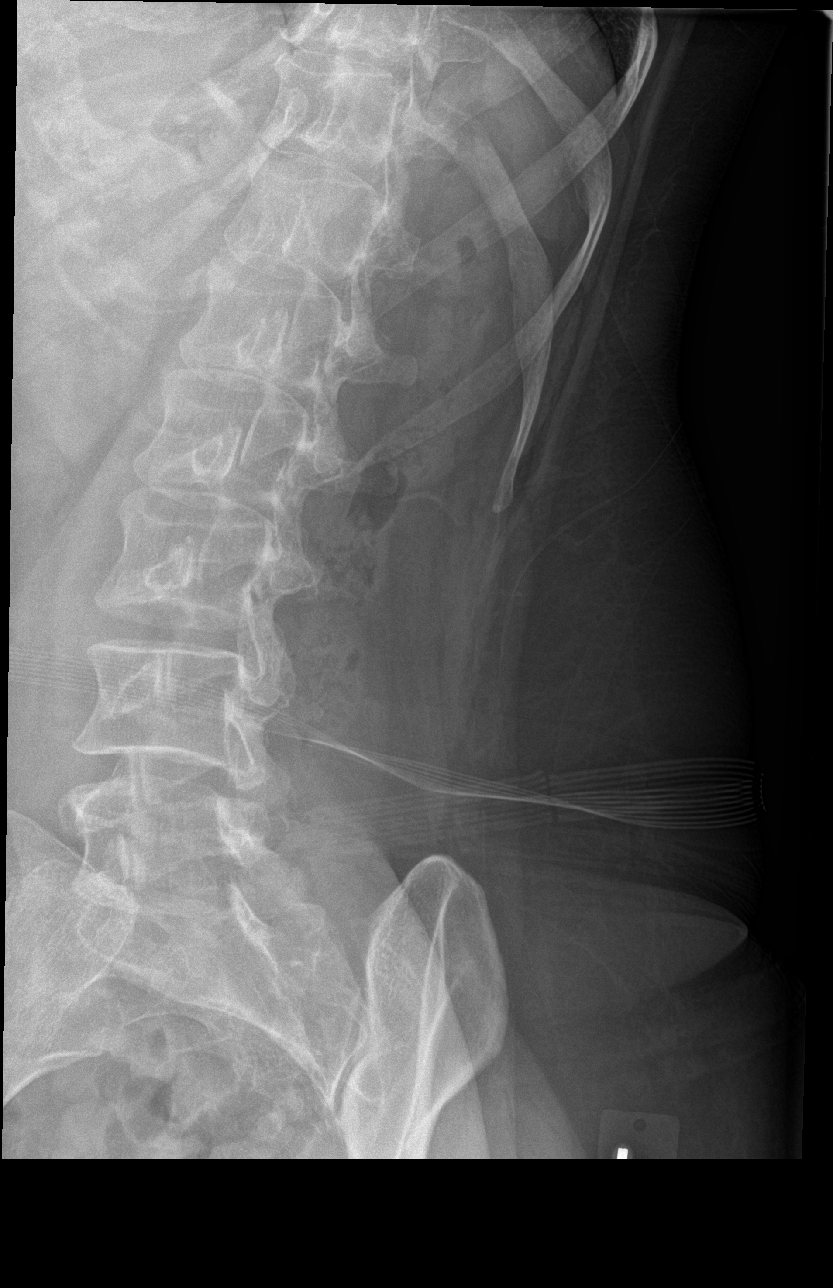

[l-spine obl (2 of 2)]
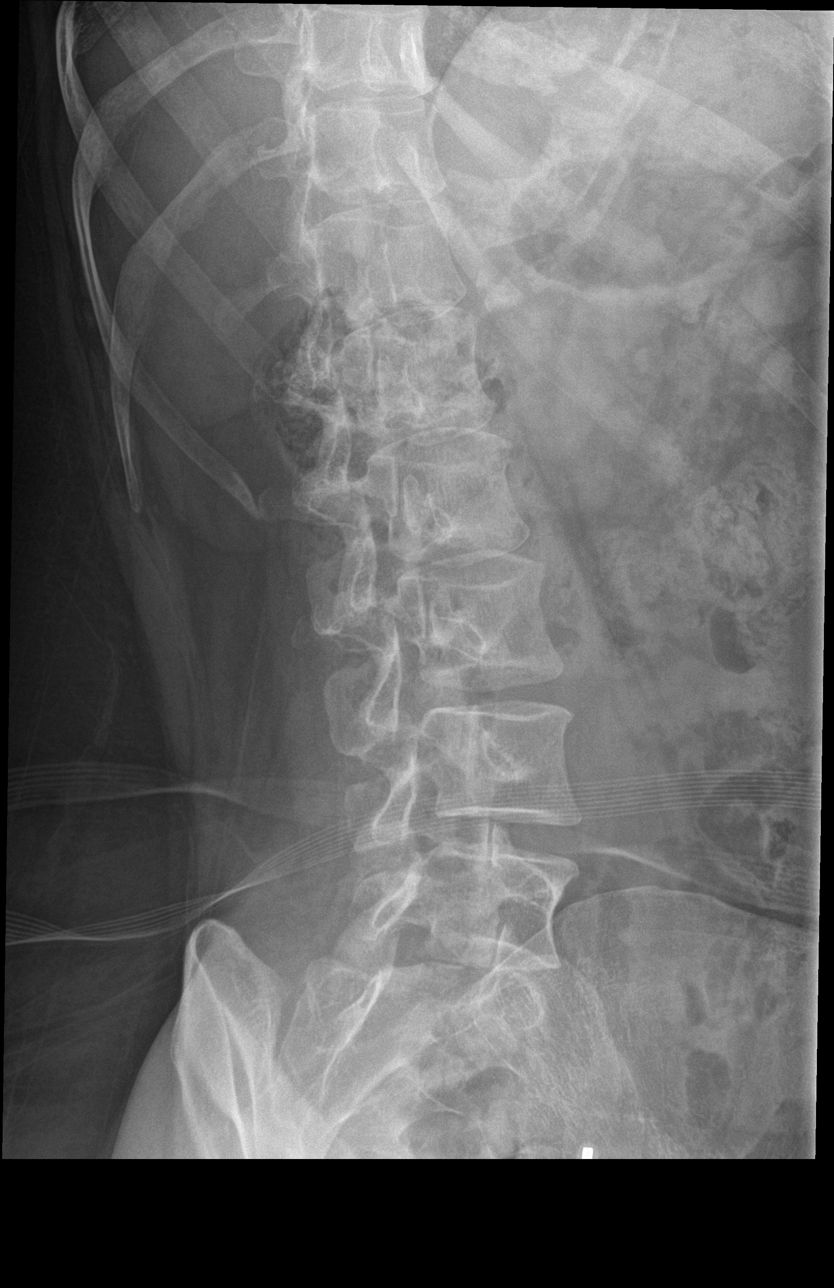

[l-spine lat]
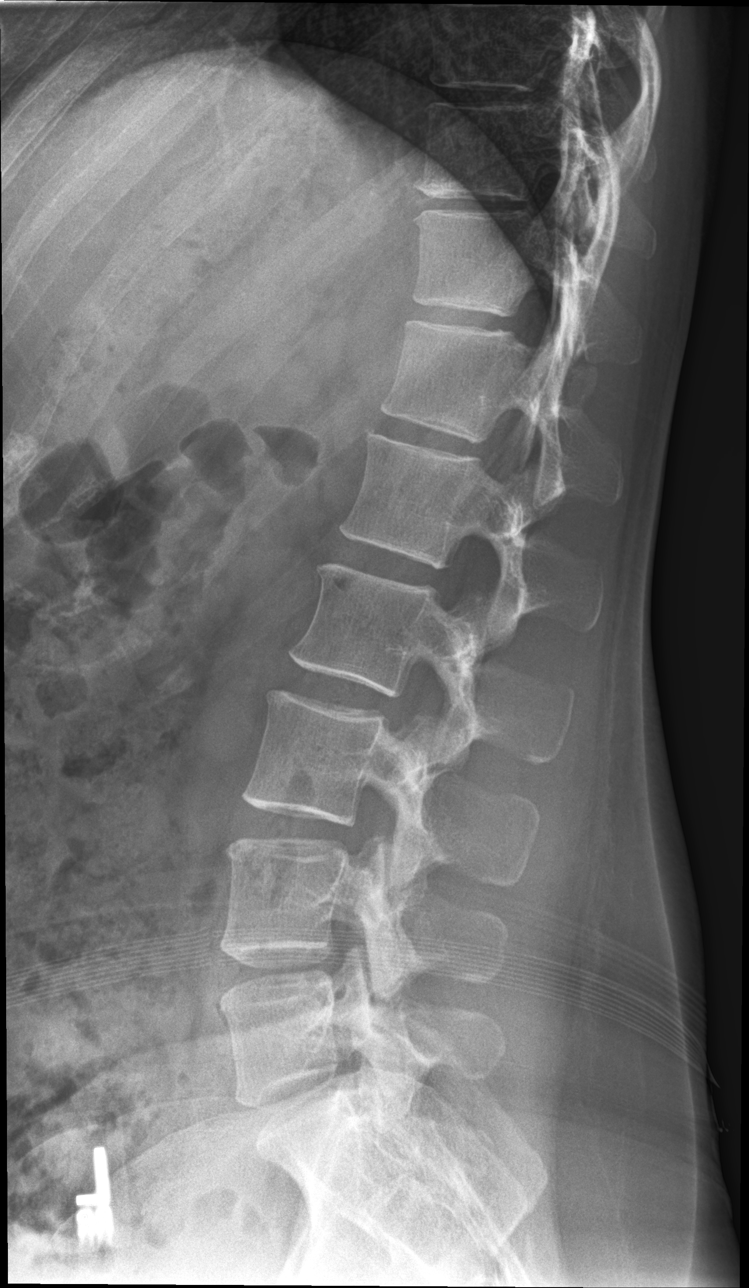

[l-spine l5-s1]
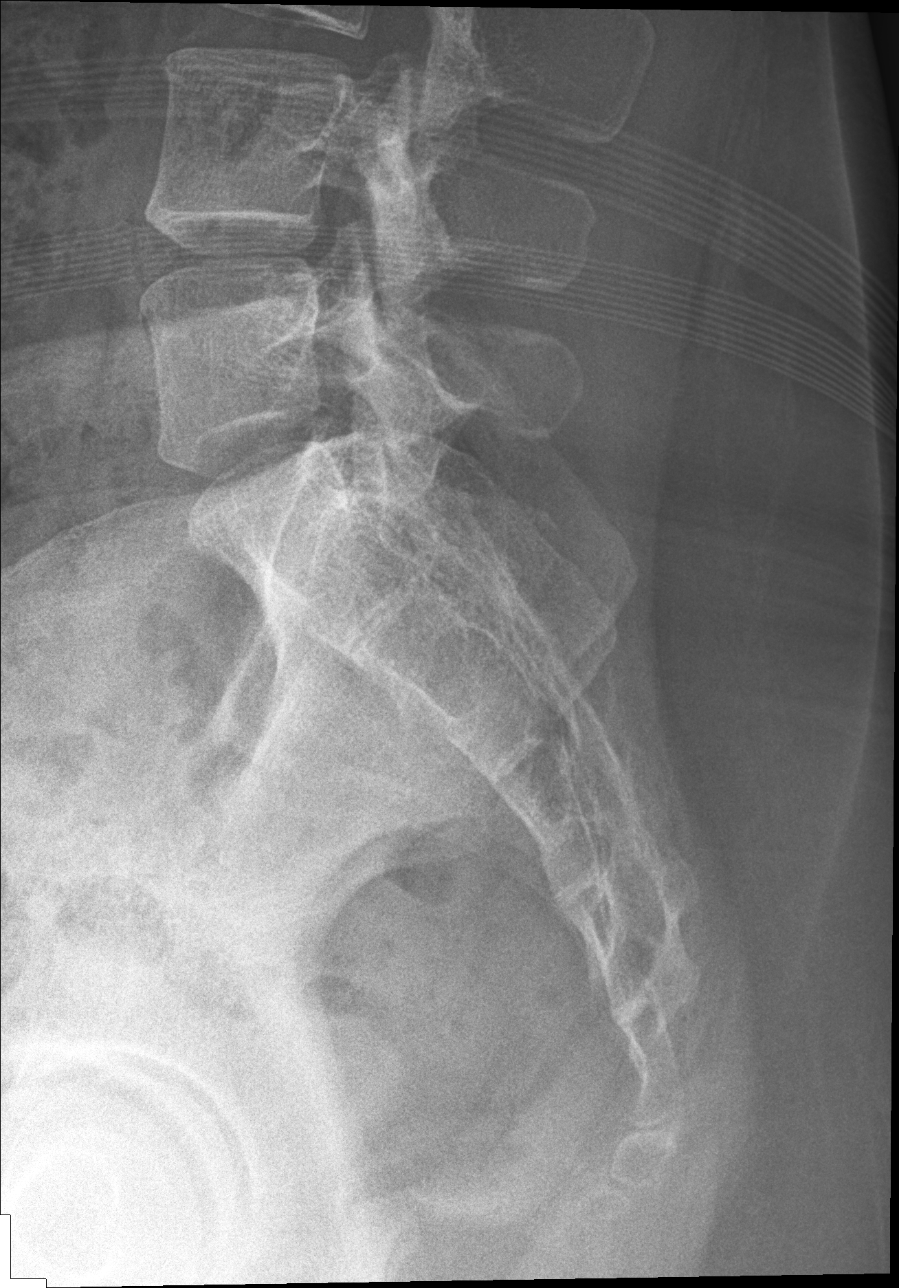

[5 of 5 positions shown; findings below may reference images not displayed]

FINDINGS: There is no evidence of lumbar spine fracture. Alignment is normal.
Intervertebral disc spaces are maintained. No evidence of facet
arthropathy or other significant bone abnormality.
IMPRESSION: Negative.

## 2017-06-09 ENCOUNTER — Encounter: Payer: Self-pay | Admitting: Physician Assistant

## 2017-06-20 ENCOUNTER — Encounter: Payer: Self-pay | Admitting: Urgent Care

## 2017-06-21 ENCOUNTER — Encounter: Payer: Self-pay | Admitting: Urgent Care

## 2017-06-21 ENCOUNTER — Ambulatory Visit (INDEPENDENT_AMBULATORY_CARE_PROVIDER_SITE_OTHER): Payer: PRIVATE HEALTH INSURANCE | Admitting: Urgent Care

## 2017-06-21 VITALS — BP 103/72 | HR 78 | Temp 97.6°F | Resp 16 | Ht 64.0 in | Wt 161.2 lb

## 2017-06-21 DIAGNOSIS — B9689 Other specified bacterial agents as the cause of diseases classified elsewhere: Secondary | ICD-10-CM | POA: Diagnosis not present

## 2017-06-21 DIAGNOSIS — I82452 Acute embolism and thrombosis of left peroneal vein: Secondary | ICD-10-CM

## 2017-06-21 DIAGNOSIS — N898 Other specified noninflammatory disorders of vagina: Secondary | ICD-10-CM | POA: Diagnosis not present

## 2017-06-21 DIAGNOSIS — I82442 Acute embolism and thrombosis of left tibial vein: Secondary | ICD-10-CM | POA: Diagnosis not present

## 2017-06-21 DIAGNOSIS — B373 Candidiasis of vulva and vagina: Secondary | ICD-10-CM

## 2017-06-21 DIAGNOSIS — Z7251 High risk heterosexual behavior: Secondary | ICD-10-CM

## 2017-06-21 DIAGNOSIS — I82412 Acute embolism and thrombosis of left femoral vein: Secondary | ICD-10-CM | POA: Diagnosis not present

## 2017-06-21 DIAGNOSIS — I82432 Acute embolism and thrombosis of left popliteal vein: Secondary | ICD-10-CM | POA: Diagnosis not present

## 2017-06-21 DIAGNOSIS — I82492 Acute embolism and thrombosis of other specified deep vein of left lower extremity: Secondary | ICD-10-CM | POA: Diagnosis not present

## 2017-06-21 DIAGNOSIS — N760 Acute vaginitis: Secondary | ICD-10-CM | POA: Diagnosis not present

## 2017-06-21 DIAGNOSIS — B3731 Acute candidiasis of vulva and vagina: Secondary | ICD-10-CM

## 2017-06-21 LAB — POCT WET + KOH PREP: Trich by wet prep: ABSENT

## 2017-06-21 MED ORDER — METRONIDAZOLE 500 MG PO TABS: 500.0000 mg | ORAL_TABLET | Freq: Two times a day (BID) | ORAL | 0 refills | Status: DC

## 2017-06-21 MED ORDER — FLUCONAZOLE 150 MG PO TABS: 150.0000 mg | ORAL_TABLET | ORAL | 0 refills | Status: DC

## 2017-06-21 NOTE — Patient Instructions (Addendum)
Safe Sex Practicing safe sex means taking steps before and during sex to reduce your risk of:  Getting an STD (sexually transmitted disease).  Giving your partner an STD.  Unwanted pregnancy.  How can I practice safe sex?  To practice safe sex:  Limit your sexual partners to only one partner who is having sex with only you.  Avoid using alcohol and recreational drugs before having sex. These substances can affect your judgment.  Before having sex with a new partner: ? Talk to your partner about past partners, past STDs, and drug use. ? You and your partner should be screened for STDs and discuss the results with each other.  Check your body regularly for sores, blisters, rashes, or unusual discharge. If you notice any of these problems, visit your health care provider.  If you have symptoms of an infection or you are being treated for an STD, avoid sexual contact.  While having sex, use a condom. Make sure to: ? Use a condom every time you have vaginal, oral, or anal sex. Both females and males should wear condoms during oral sex. ? Keep condoms in place from the beginning to the end of sexual activity. ? Use a latex condom, if possible. Latex condoms offer the best protection. ? Use only water-based lubricants or oils to lubricate a condom. Using petroleum-based lubricants or oils will weaken the condom and increase the chance that it will break.  See your health care provider for regular screenings, exams, and tests for STDs.  Talk with your health care provider about the form of birth control (contraception) that is best for you.  Get vaccinated against hepatitis B and human papillomavirus (HPV).  If you are at risk of being infected with HIV (human immunodeficiency virus), talk with your health care provider about taking a prescription medicine to prevent HIV infection. You are considered at risk for HIV if: ? You are a man who has sex with other men. ? You are a  heterosexual man or woman who is sexually active with more than one partner. ? You take drugs by injection. ? You are sexually active with a partner who has HIV.  This information is not intended to replace advice given to you by your health care provider. Make sure you discuss any questions you have with your health care provider. Document Released: 04/01/2004 Document Revised: 07/09/2015 Document Reviewed: 01/12/2015 Elsevier Interactive Patient Education  2018 [employer]vier Inc.      Deep Vein Thrombosis Deep vein thrombosis (DVT) is a condition in which a blood clot forms in a deep vein, such as a lower leg, thigh, or arm vein. A clot is blood that has thickened into a gel or solid. This condition is dangerous. It can lead to serious and even life-threatening complications if the clot travels to the lungs and causes a blockage (pulmonary embolism). It can also damage veins in the leg. This can result in leg pain, swelling, discoloration, and sores (post-thrombotic syndrome). What are the causes? This condition may be caused by:  A slowdown of blood flow.  Damage to a vein.  A condition that makes blood clot more easily.  What increases the risk? The following factors may make you more likely to develop this condition:  Being overweight.  Being elderly, especially over age 56.  Sitting or lying down for more than four hours.  Lack of physical activity (sedentary lifestyle).  Being pregnant, giving birth, or having recently given birth.  Taking medicines that contain  estrogen.  Smoking.  A history of any of the following: ? Blood clots or blood clotting disease. ? Peripheral vascular disease. ? Inflammatory bowel disease. ? Cancer. ? Heart disease. ? Genetic conditions that affect how blood clots. ? Neurological diseases that affect the legs (leg paresis). ? Injury. ? Major or lengthy surgery. ? A central line placed inside a large vein.  What are the signs or  symptoms? Symptoms of this condition include:  Swelling, pain, or tenderness in an arm or leg.  Warmth, redness, or discoloration in an arm or leg.  If the clot is in your leg, symptoms may be more noticeable or worse when you stand or walk. Some people do not have any symptoms. How is this diagnosed? This condition is diagnosed with:  A medical history.  A physical exam.  Tests, such as: ? Blood tests. These are done to see how your blood clots. ? Imaging tests. These are done to check for clots. Tests may include:  Ultrasound.  CT scan.  MRI.  X-ray.  Venogram. For this test, X-rays are taken after a dye is injected into a vein.  How is this treated? Treatment for this condition depends on the cause, your risk for bleeding or developing more clots, and any medical conditions you have. Treatment may include:  Taking blood thinners (also called anticoagulants). These medicines may be taken by mouth, injected under the skin, or injected through an IV tube (catheter). These medicines prevent clots from forming.  Injecting medicine that dissolves blood clots into the affected vein (catheter-directed thrombolysis).  Having surgery. Surgery may be done to: ? Remove the clot. ? Place a filter in a large vein to catch blood clots before they reach the lungs.  Some treatments may be continued for up to six months. Follow these instructions at home: If you are taking an oral blood thinner:  Take the medicine exactly as told by your health care provider. Some blood thinners need to be taken at the same time every day. Do not skip a dose.  Ask your health care provider about what foods and drugs interact with the medicine.  Ask about possible side effects. General instructions  Blood thinners can cause easy bruising and difficulty stopping bleeding. Because of this, if you are taking or were given a blood thinner: ? Hold pressure over cuts for longer than usual. ? Tell your  dentist and other health care providers that you are taking blood thinners before having any procedures that can cause bleeding. ? Avoid contact sports.  Take over-the-counter and prescription medicines only as told by your health care provider.  Return to your normal activities as told by your health care provider. Ask your health care provider what activities are safe for you.  Wear compression stockings if recommended by your health care provider.  Keep all follow-up visits as told by your health care provider. This is important. How is this prevented? To lower your risk of developing this condition again:  For 30 or more minutes every day, do an activity that: ? Involves moving your arms and legs. ? Increases your heart rate.  When traveling for longer than four hours: ? Exercise your arms and legs every hour. ? Drink plenty of water. ? Avoid drinking alcohol.  Avoid sitting or lying for a long time without moving your legs.  Stay a healthy weight.  If you are a woman who is older than age 53, avoid unnecessary use of medicines that contain estrogen.  Do not use any products that contain nicotine or tobacco, such as cigarettes and e-cigarettes. This is especially important if you take estrogen medicines. If you need help quitting, ask your health care provider.  Contact a health care provider if:  You miss a dose of your blood thinner.  You have nausea, vomiting, or diarrhea that lasts for more than one day.  Your menstrual period is heavier than usual.  You have unusual bruising. Get help right away if:  You have new or increased pain, swelling, or redness in an arm or leg.  You have numbness or tingling in an arm or leg.  You have shortness of breath.  You have chest pain.  You have a rapid or irregular heartbeat.  You feel light-headed or dizzy.  You cough up blood.  There is blood in your vomit, stool, or urine.  You have a serious fall or accident, or  you hit your head.  You have a severe headache or confusion.  You have a cut that will not stop bleeding. These symptoms may represent a serious problem that is an emergency. Do not wait to see if the symptoms will go away. Get medical help right away. Call your local emergency services (911 in the [place] Do not drive yourself to the hospital. Summary  DVT is a condition in which a blood clot forms in a deep vein, such as a lower leg, thigh, or arm vein.  Symptoms can include swelling, warmth, pain, and redness in your leg or arm.  Treatment may include taking blood thinners, injecting medicine that dissolves blood clots,wearing compression stockings, or surgery.  If you are prescribed blood thinners, take them exactly as told. This information is not intended to replace advice given to you by your health care provider. Make sure you discuss any questions you have with your health care provider. Document Released: 02/22/2005 Document Revised: 03/27/2016 Document Reviewed: 03/27/2016 Elsevier Interactive Patient Education  2018 [employer]      IF you received an x-ray today, you will receive an invoice from [facility]. Please contact [facility] at [phone] with questions or concerns regarding your invoice.   IF you received labwork today, you will receive an invoice from [facility]. Please contact [facility] at [phone] with questions or concerns regarding your invoice.   Our billing staff will not be able to assist you with questions regarding bills from these companies.  You will be contacted with the lab results as soon as they are available. The fastest way to get your results is to activate your My Chart account. Instructions are located on the last page of this paperwork. If you have not heard from us regarding the results in 2 weeks, please contact this office.

## 2017-06-21 NOTE — Progress Notes (Signed)
MRN: [id number] DOB: [birth year]  Subjective:   Zoe Ryan is a 37 y.o. female presenting for follow-up on left leg DVT.  Patient is currently taking 1 g Xarelto daily.  At her last office visit, patient was instructed not to take oral contraception as this is contraindicated for her DVT.  Patient is currently working with a gynecologist and reports that they have discussed this and will try the Nexplanon even though she has DVT.  Patient would like to consider coming off of Xarelto because of how expensive it is.  She denies recurrence of her symptoms including no fever, chest pain, heart racing, left leg pain, left leg swelling, warmth, redness.  She denies smoking cigarettes.  Patient would also like to get tested for STI's.  She did have sex with a new partner recently and did not use a condom.  She currently reports that she has some vaginal itching and discharge.  Denies dysuria, hematuria, urinary frequency, pelvic pain, genital rash.  Zoe Ryan has a current medication list which includes the following prescription(s): alprazolam, aripiprazole, armodafinil, bupropion, escitalopram, fluticasone, multivitamin with minerals, rivaroxaban, ergocalciferol, zaleplon, and rivaroxaban. Also has No Known Allergies.  Zoe Ryan  has a past medical history of Anxiety, Chronic shoulder pain, Depression, and History of tobacco use. Also  has a past surgical history that includes Arthroscopic repair ACL.  Objective:   Vitals: BP 103/72   Pulse 78   Temp 97.6 F (36.4 C) (Oral)   Resp 16   Ht 5\' 4"  (1.626 m)   Wt 161 lb 3.2 oz (73.1 kg)   SpO2 99%   BMI 27.67 kg/m   Physical Exam  Constitutional: She is oriented to person, place, and time. She appears well-developed and well-nourished.  Cardiovascular: Normal rate, regular rhythm and intact distal pulses. Exam reveals no gallop and no friction rub.  No murmur heard. Pulmonary/Chest: No respiratory distress. She has no wheezes. She has no rales.    Neurological: She is alert and oriented to person, place, and time.    Results for orders placed or performed in visit on 06/21/17 (from the past 24 hour(s))  POCT Wet + KOH Prep     Status: Abnormal   Collection Time: 06/21/17 11:59 AM  Result Value Ref Range   Yeast by KOH Present (A) Absent   Yeast by wet prep Present (A) Absent   WBC by wet prep Few Few   Clue Cells Wet Prep HPF POC Many (A) None   Trich by wet prep Absent Absent   Bacteria Wet Prep HPF POC Many (A) Few   Epithelial Cells By [employer] Pref ([facility]) Few None, Few, Too numerous to count   RBC,UR,HPF,POC None None RBC/hpf    Assessment and Plan :   Vaginal discharge - Plan: POCT Wet + KOH Prep, HIV antibody, RPR, Trichomonas vaginalis, RNA, GC/Chlamydia Probe Amp([facility])  Acute deep vein thrombosis (DVT) of femoral vein of left lower extremity (HCC) - Plan: VAS [place] LOWER EXTREMITY VENOUS (DVT)  Acute deep vein thrombosis (DVT) of popliteal vein of left lower extremity (HCC) - Plan: VAS [place] LOWER EXTREMITY VENOUS (DVT)  Acute deep vein thrombosis (DVT) of left tibial vein (HCC) - Plan: VAS [place] LOWER EXTREMITY VENOUS (DVT)  Acute deep vein thrombosis (DVT) of left peroneal vein (HCC) - Plan: VAS [place] LOWER EXTREMITY VENOUS (DVT)  Yeast vaginitis  Bacterial vaginosis  Unprotected sex  Labs pending, will treat patient for yeast vaginitis and bacterial vaginosis.  Counseled on safe sex practices.  We discussed need for Xarelto especially if she plans on following through with Nexplanon.  I reviewed contraindications, patient verbalized understanding.  She will discuss this again with her gynecologist.  We will pursue a left leg ultrasound to see the progress of her DVT.  Wallis BambergMario Wonda Goodgame, PA-C [facility] at [facility] [phone] 06/21/2017  11:51 AM

## 2017-06-22 LAB — RPR: RPR: NONREACTIVE

## 2017-06-22 LAB — HIV ANTIBODY (ROUTINE TESTING W REFLEX): HIV Screen 4th Generation wRfx: NONREACTIVE

## 2017-06-23 LAB — TRICHOMONAS VAGINALIS, PROBE AMP: Trich vag by NAA: NEGATIVE

## 2017-06-23 LAB — GC/CHLAMYDIA PROBE AMP
CHLAMYDIA, DNA PROBE: NEGATIVE
NEISSERIA GONORRHOEAE BY PCR: NEGATIVE

## 2017-06-24 ENCOUNTER — Encounter: Payer: Self-pay | Admitting: Urgent Care

## 2017-06-30 ENCOUNTER — Other Ambulatory Visit: Payer: Self-pay | Admitting: Urgent Care

## 2017-07-02 ENCOUNTER — Other Ambulatory Visit: Payer: Self-pay | Admitting: Urgent Care

## 2017-07-11 ENCOUNTER — Encounter: Payer: Self-pay | Admitting: Urgent Care

## 2017-10-12 ENCOUNTER — Other Ambulatory Visit: Payer: Self-pay | Admitting: Urgent Care

## 2017-10-13 NOTE — Telephone Encounter (Signed)
LOV 06/21/17 Wallis BambergMario Mani Last refill 03/22/17  #[phone] with 1 refill  Outpatient Pharmacy

## 2017-11-29 ENCOUNTER — Encounter: Payer: Self-pay | Admitting: Behavioral Health

## 2017-12-13 ENCOUNTER — Ambulatory Visit (INDEPENDENT_AMBULATORY_CARE_PROVIDER_SITE_OTHER): Payer: No Typology Code available for payment source | Admitting: Psychiatry

## 2017-12-13 DIAGNOSIS — F411 Generalized anxiety disorder: Secondary | ICD-10-CM

## 2017-12-13 DIAGNOSIS — F331 Major depressive disorder, recurrent, moderate: Secondary | ICD-10-CM

## 2017-12-13 DIAGNOSIS — F4001 Agoraphobia with panic disorder: Secondary | ICD-10-CM | POA: Diagnosis not present

## 2017-12-13 MED ORDER — BREXPIPRAZOLE 2 MG PO TABS: 2.0000 mg | ORAL_TABLET | Freq: Every day | ORAL | 1 refills | Status: DC

## 2017-12-13 NOTE — Progress Notes (Signed)
[facility]  Patient ID: Zoe Ryan,  MRN: [id number]  PCP: Patient, No Pcp Per  Date of Evaluation: 12/13/2017 Time spent:30 minutes   HISTORY/CURRENT STATUS: HPI FU dep/anxiety. TRD. Not sure if better than last time.  Can't remember being happy well over a year or 2.  Anhedonia.  Little activity.  Trying to be more social and it helps with pleasure.  Low interest.  Conc fair. Performance ok.  Sleep too little or much. Sleep bc bored.    Work stress with others leaving.  Charge tech increase stress, promotion.  Learning it.   Divorce affecting mood, restarting process legally.  Can't cry though wants to.  Not experienced as SE SSRI before. Anx fair. Some avoidance of tasks.  History of failure or inadequate response to sertraline, Lexapro, paroxetine, fluoxetine, venlafaxine, lithium, lamotrigine, valproic acid, and aripiprazole  Individual Medical History/ Review of Systems: Changes? :No  Allergies: Patient has no known allergies.  Current Medications:  Current Outpatient Medications:  .  ALPRAZolam (XANAX) 0.5 MG tablet, Take 0.5 mg by mouth at bedtime as needed. Take 0.25 mg to 0.5 mg at bedtime as needed for anxiety , Disp: , Rfl:  .  ARIPiprazole (ABILIFY) 10 MG tablet, Take 10 mg by mouth daily. , Disp: , Rfl:  .  Armodafinil (NUVIGIL) 250 MG tablet, Take 250 mg by mouth., Disp: , Rfl:  .  buPROPion (WELLBUTRIN XL) 150 MG 24 hr tablet, Take 450 mg by mouth daily. , Disp: , Rfl:  .  escitalopram (LEXAPRO) 20 MG tablet, , Disp: , Rfl: 1 .  fluticasone (FLONASE) 50 MCG/ACT nasal spray, Place 2 sprays into both nostrils daily., Disp: 48 g, Rfl: 3 .  XARELTO 20 MG TABS tablet, TAKE 1 TABLET BY MOUTH DAILY WITH SUPPER., Disp: 90 tablet, Rfl: 1 .  zaleplon (SONATA) 10 MG capsule, TAKE 1 CAPSULE BY MOUTH DAILY AT BEDTIME AS NEEDED FOR INSOMNIA, Disp: , Rfl: 1 .  fluconazole (DIFLUCAN) 150 MG tablet, Take 1 tablet (150 mg total) by mouth once a week. (Patient not taking:  Reported on 12/13/2017), Disp: 2 tablet, Rfl: 0 .  metroNIDAZOLE (FLAGYL) 500 MG tablet, Take 1 tablet (500 mg total) by mouth 2 (two) times daily with a meal. DO NOT CONSUME ALCOHOL WHILE TAKING THIS MEDICATION. (Patient not taking: Reported on 12/13/2017), Disp: 14 tablet, Rfl: 0 .  Multiple Vitamin (MULITIVITAMIN WITH MINERALS) TABS, Take 1 tablet by mouth daily.  , Disp: , Rfl:  .  VITAMIN D, ERGOCALCIFEROL, PO, Take by mouth., Disp: , Rfl:     Medication Side Effects: None  Family Medical/ Social History: Changes? Yes noted job change.  Pending divorce.  No alcohol abuse.  Continues psychotherapy.  MENTAL HEALTH EXAM:  There were no vitals taken for this visit.There is no height or weight on file to calculate BMI.  General Appearance: Casual  Eye Contact:  Good  Speech:  Clear and Coherent and Normal Rate  Volume:  Normal  Mood:  Depressed  Affect:  Appropriate, Depressed and anxious  Thought Process:  Goal Directed  Orientation:  Full (Time, Place, and Person)  Thought Content: WDL and Logical   Suicidal Thoughts:  No  Homicidal Thoughts:  No  Memory:  Recent  Judgement:  Good  Insight:  Good  Psychomotor Activity:  Normal  Concentration:  Concentration: Good  Recall:  Good  Fund of Knowledge: Good  Language: Good  Akathisia:  No  AIMS (if indicated): not done  Assets:  Desire  for Improvement Housing Leisure Time  ADL's:  Intact  Cognition: WNL  Prognosis:  Good    DIAGNOSES:    ICD-10-CM   1. Major depressive disorder, recurrent episode, moderate (HCC) F33.1   2. Generalized anxiety disorder F41.1   3. Panic disorder with agoraphobia F40.01     RECOMMENDATIONS: SSRi can reduce tearfulness. No benefit from increased Abilify. Prior benefit and lost it therfore change to Rexulti.  Necessary polypharmacy 2nd TRD.  Samples 1 to 2 mg goal/D Disc SE. Call if not tolerated. Disc TD. Consider TMS that secondary treatment failure.   She is going through a divorce and  it is clearly a contributing factor to her depression.  She will continue counseling for that.      Lauraine Rinne, MD

## 2017-12-13 NOTE — Patient Instructions (Signed)
Stop abilify. Start Rexulti and increase to 2mg  daily.

## 2018-01-13 ENCOUNTER — Ambulatory Visit (INDEPENDENT_AMBULATORY_CARE_PROVIDER_SITE_OTHER): Payer: No Typology Code available for payment source | Admitting: Psychiatry

## 2018-01-13 ENCOUNTER — Encounter: Payer: Self-pay | Admitting: Psychiatry

## 2018-01-13 DIAGNOSIS — G4726 Circadian rhythm sleep disorder, shift work type: Secondary | ICD-10-CM

## 2018-01-13 DIAGNOSIS — F4001 Agoraphobia with panic disorder: Secondary | ICD-10-CM

## 2018-01-13 DIAGNOSIS — F332 Major depressive disorder, recurrent severe without psychotic features: Secondary | ICD-10-CM

## 2018-01-13 DIAGNOSIS — F411 Generalized anxiety disorder: Secondary | ICD-10-CM

## 2018-01-13 MED ORDER — DULOXETINE HCL 30 MG PO CPEP: ORAL_CAPSULE | ORAL | 0 refills | Status: DC

## 2018-01-13 MED ORDER — DULOXETINE HCL 60 MG PO CPEP: 60.0000 mg | ORAL_CAPSULE | Freq: Every day | ORAL | 1 refills | Status: DC

## 2018-01-13 NOTE — Patient Instructions (Signed)
Start one half Lexapro and 30 mg duloxetine together for 1 week. Then stop Lexapro and increase to 2 of the 30 mg of duloxetine or 60 mg total per day  Immediately stop Rexulti and start Vraylar 1.5 mg each morning
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
## 2018-01-19 ENCOUNTER — Other Ambulatory Visit: Payer: Self-pay | Admitting: Psychiatry

## 2018-01-30 ENCOUNTER — Other Ambulatory Visit: Payer: Self-pay

## 2018-01-30 ENCOUNTER — Telehealth: Payer: Self-pay | Admitting: Psychiatry

## 2018-01-30 MED ORDER — CARIPRAZINE HCL 1.5 MG PO CAPS: 1.5000 mg | ORAL_CAPSULE | Freq: Every day | ORAL | 2 refills | Status: DC

## 2018-01-30 NOTE — Telephone Encounter (Signed)
Need to call pt to see how she's doing on dose

## 2018-01-30 NOTE — Telephone Encounter (Signed)
Pt doing well on vraylar 1.5 mg needs refill sent to pharmacy

## 2018-01-30 NOTE — Telephone Encounter (Signed)
Pt is taking samples of Vraylar 1.5mg  at this time. Needs a refill called in but not sure if Dr. Jennelle Humanottle wants to adjust the dose.  [facility]

## 2018-02-20 ENCOUNTER — Ambulatory Visit (INDEPENDENT_AMBULATORY_CARE_PROVIDER_SITE_OTHER): Payer: No Typology Code available for payment source | Admitting: Psychiatry

## 2018-02-20 ENCOUNTER — Encounter: Payer: Self-pay | Admitting: Psychiatry

## 2018-02-20 ENCOUNTER — Other Ambulatory Visit: Payer: Self-pay

## 2018-02-20 DIAGNOSIS — F4001 Agoraphobia with panic disorder: Secondary | ICD-10-CM | POA: Diagnosis not present

## 2018-02-20 DIAGNOSIS — F411 Generalized anxiety disorder: Secondary | ICD-10-CM | POA: Diagnosis not present

## 2018-02-20 DIAGNOSIS — G4726 Circadian rhythm sleep disorder, shift work type: Secondary | ICD-10-CM

## 2018-02-20 DIAGNOSIS — F331 Major depressive disorder, recurrent, moderate: Secondary | ICD-10-CM

## 2018-02-20 DIAGNOSIS — F332 Major depressive disorder, recurrent severe without psychotic features: Secondary | ICD-10-CM

## 2018-02-20 MED ORDER — DULOXETINE HCL 30 MG PO CPEP: 90.0000 mg | ORAL_CAPSULE | Freq: Every day | ORAL | 1 refills | Status: DC

## 2018-02-20 NOTE — Progress Notes (Signed)
Zoe Ryan [id number] March 22, 1980 37 y.o.  Subjective:   Patient ID:  Zoe Ryan is a 37 y.o. (DOB [birth year]) female.  Chief Complaint:  Chief Complaint  Patient presents with  . Follow-up    Medication Management    HPI Zoe Ryan presents to the office today for follow-up of depression that failed to respond to lexapro plus Rexulti.  Changed to Cymbalta and Vraylar.  Found out that brother is on Vraylar and it helps him.  He suffers from Dep and anxiety.  Better re: dep and anxiety after about 10 days.  Wasn't feeling nearly as incapacitated and anxity a little better.  Easier to leave the house.  Better productivity at home and felt more at ease.  Dep improved from 10/10 to 5/10 now.  Anxiety is still a 7/10.  Not having as many panic spells to 1/week..  Mostly generalized..  No mood swings.  Mood fairly stable.  Less overwhelmed.  Left work early 1 time.  Sleep about 6 hours at best and not good.  Sleeping on couch.  Problems going to bed.  Anxiety about going to bed and starting the next day so falls asleep on the couch.   Failed psychiatric medications include sertraline, Lexapro, paroxetine, fluoxetine, venlafaxine, lithium, lamotrigine, valproic acid, Abilify, lithium, Rexulti Review of Systems:  Review of Systems  Neurological: Negative for tremors and weakness.  Psychiatric/Behavioral: Positive for dysphoric mood and sleep disturbance. Negative for agitation, behavioral problems, confusion, decreased concentration, hallucinations, self-injury and suicidal ideas. The patient is nervous/anxious. The patient is not hyperactive.     Medications: I have reviewed the patient's current medications.  Current Outpatient Medications  Medication Sig Dispense Refill  . ALPRAZolam (XANAX) 0.5 MG tablet Take 0.5 mg by mouth at bedtime as needed. Take 0.25 mg to 0.5 mg at bedtime as needed for anxiety     . Armodafinil (NUVIGIL) 250 MG tablet Take 250 mg by mouth.    Marland Kitchen buPROPion  (WELLBUTRIN XL) 150 MG 24 hr tablet TAKE 3 TABLETS BY MOUTH EVERY MORNING (Patient taking differently: Take 150 mg by mouth daily. ) 270 tablet 0  . cariprazine (VRAYLAR) capsule Take 1 capsule (1.5 mg total) by mouth daily. 30 capsule 2  . DULoxetine (CYMBALTA) 60 MG capsule Take 1 capsule (60 mg total) by mouth daily. 30 capsule 1  . etonogestrel (NEXPLANON) 68 MG IMPL implant 1 each by Subdermal route once.    . fluticasone (FLONASE) 50 MCG/ACT nasal spray Place 2 sprays into both nostrils daily. 48 g 3  . Multiple Vitamin (MULITIVITAMIN WITH MINERALS) TABS Take 1 tablet by mouth daily.      [name] 20 MG TABS tablet TAKE 1 TABLET BY MOUTH DAILY WITH SUPPER. 90 tablet 1  . zaleplon (SONATA) 10 MG capsule TAKE 1 CAPSULE BY MOUTH DAILY AT BEDTIME AS NEEDED FOR INSOMNIA  1  . fluconazole (DIFLUCAN) 150 MG tablet Take 1 tablet (150 mg total) by mouth once a week. (Patient not taking: Reported on 02/20/2018) 2 tablet 0  . metroNIDAZOLE (FLAGYL) 500 MG tablet Take 1 tablet (500 mg total) by mouth 2 (two) times daily with a meal. DO NOT CONSUME ALCOHOL WHILE TAKING THIS MEDICATION. (Patient not taking: Reported on 02/20/2018) 14 tablet 0  . VITAMIN D, ERGOCALCIFEROL, PO Take by mouth.     No current facility-administered medications for this visit.     Medication Side Effects: None  Allergies: No Known Allergies  Past Medical History:  Diagnosis Date  . Anxiety   .  Chronic shoulder pain   . Depression   . History of tobacco use    Quit 2013    Family History  Problem Relation Age of Onset  . Anxiety disorder Maternal Grandmother   . Mental retardation Maternal Grandmother   . Congestive Heart Failure Maternal Grandfather   . Cancer Maternal Grandfather        lymphoma  . Stroke Paternal Grandfather   . Mental illness Brother        depression  . Hyperlipidemia Brother     Social History   Socioeconomic History  . Marital status: Married    Spouse name: Ronaldo MiyamotoKyle  . Number of  children: 0  . Years of education: College  . Highest education level: Not on file  Occupational History  . Occupation: Building control surveyorlab tech    Employer: [employer]  Social Needs  . Financial resource strain: Not on file  . Food insecurity:    Worry: Not on file    Inability: Not on file  . Transportation needs:    Medical: Not on file    Non-medical: Not on file  Tobacco Use  . Smoking status: Former Games developermoker  . Smokeless tobacco: Never Used  Substance and Sexual Activity  . Alcohol use: Yes    Alcohol/week: 11.0 standard drinks    Types: 11 Cans of beer per week  . Drug use: No  . Sexual activity: Never    Partners: Male    Comment: NuvaRing  Lifestyle  . Physical activity:    Days per week: Not on file    Minutes per session: Not on file  . Stress: Not on file  Relationships  . Social connections:    Talks on phone: Not on file    Gets together: Not on file    Attends religious service: Not on file    Active member of club or organization: Not on file    Attends meetings of clubs or organizations: Not on file    Relationship status: Not on file  . Intimate partner violence:    Fear of current or ex partner: Not on file    Emotionally abused: Not on file    Physically abused: Not on file    Forced sexual activity: Not on file  Other Topics Concern  . Not on file  Social History Narrative   Lives with her husband.     Education: College   Exercise: Yes    Past Medical History, Surgical history, Social history, and Family history were reviewed and updated as appropriate.   Please see review of systems for further details on the patient's review from today.   Objective:   Physical Exam:  There were no vitals taken for this visit.  Physical Exam Constitutional:      General: She is not in acute distress.    Appearance: Normal appearance. She is well-developed.  Musculoskeletal:        General: No deformity.  Neurological:     Mental Status: She is alert and  oriented to person, place, and time.     Motor: No tremor.     Coordination: Coordination normal.     Gait: Gait normal.  Psychiatric:        Attention and Perception: Attention and perception normal.        Mood and Affect: Mood is anxious and depressed. Affect is not labile, blunt, angry or inappropriate.        Speech: Speech normal.  Behavior: Behavior normal.        Thought Content: Thought content normal. Thought content does not include homicidal or suicidal ideation. Thought content does not include homicidal or suicidal plan.        Cognition and Memory: Cognition normal.        Judgment: Judgment normal.     Comments: Insight intact. No auditory or visual hallucinations. No delusions.      Lab Review:     Component Value Date/Time   NA 137 01/04/2017 1420   K 4.0 01/04/2017 1420   CL 98 01/04/2017 1420   CO2 23 01/04/2017 1420   GLUCOSE 86 01/04/2017 1420   GLUCOSE 76 07/08/2015 1440   BUN 6 01/04/2017 1420   CREATININE 0.71 01/04/2017 1420   CREATININE 0.66 07/08/2015 1440   CALCIUM 9.3 01/04/2017 1420   PROT 7.9 01/04/2017 1420   ALBUMIN 3.9 01/04/2017 1420   AST 22 01/04/2017 1420   ALT 26 01/04/2017 1420   ALKPHOS 99 01/04/2017 1420   BILITOT 0.2 01/04/2017 1420   GFRNONAA 111 01/04/2017 1420   GFRAA 128 01/04/2017 1420       Component Value Date/Time   WBC 7.7 01/04/2017 1420   WBC 6.4 07/08/2015 1440   RBC 4.48 01/04/2017 1420   RBC 4.38 07/08/2015 1440   HGB 14.3 01/04/2017 1420   HCT 40.9 01/04/2017 1420   PLT 363 01/04/2017 1420   MCV 91 01/04/2017 1420   MCH 31.9 01/04/2017 1420   MCH 32.4 07/08/2015 1440   MCHC 35.0 01/04/2017 1420   MCHC 34.4 07/08/2015 1440   RDW 14.3 01/04/2017 1420   LYMPHSABS 3.0 01/04/2017 1420   MONOABS 384 07/08/2015 1440   EOSABS 0.0 01/04/2017 1420   BASOSABS 0.0 01/04/2017 1420    No results found for: POCLITH, LITHIUM   No results found for: PHENYTOIN, PHENOBARB, VALPROATE, CBMZ    .res Assessment: Plan:    Major depressive disorder, recurrent episode, moderate (HCC)  Generalized anxiety disorder  Panic disorder with agoraphobia  Shift work sleep disorder   Partial response but is better than last visit.  Sx still moderately severe with dysfunction  Disc SE Vraylar and long half life.  Consider increase later if needed. Discussed potential metabolic side effects associated with atypical antipsychotics, as well as potential risk for movement side effects. Advised pt to contact office if movement side effects occur.   Increase duloxetine from 60 to 90 mg daily for depresssion and anxiety. Disc SE  Find out about brother's meds DT genetics.  Shift work managed with Nuvigil  FU 1 month  Meredith Staggers, MD, DFAPA   Please see After Visit Summary for patient specific instructions.  No future appointments.  No orders of the defined types were placed in this encounter.     -------------------------------

## 2018-04-03 ENCOUNTER — Encounter: Payer: Self-pay | Admitting: Psychiatry

## 2018-04-03 ENCOUNTER — Ambulatory Visit (INDEPENDENT_AMBULATORY_CARE_PROVIDER_SITE_OTHER): Payer: No Typology Code available for payment source | Admitting: Psychiatry

## 2018-04-03 DIAGNOSIS — F411 Generalized anxiety disorder: Secondary | ICD-10-CM | POA: Diagnosis not present

## 2018-04-03 DIAGNOSIS — F4001 Agoraphobia with panic disorder: Secondary | ICD-10-CM | POA: Diagnosis not present

## 2018-04-03 DIAGNOSIS — F331 Major depressive disorder, recurrent, moderate: Secondary | ICD-10-CM

## 2018-04-03 DIAGNOSIS — G4726 Circadian rhythm sleep disorder, shift work type: Secondary | ICD-10-CM

## 2018-04-03 MED ORDER — DULOXETINE HCL 60 MG PO CPEP: 120.0000 mg | ORAL_CAPSULE | Freq: Every day | ORAL | 1 refills | Status: DC

## 2018-04-03 MED ORDER — CARIPRAZINE HCL 3 MG PO CAPS: 3.0000 mg | ORAL_CAPSULE | Freq: Every day | ORAL | 1 refills | Status: DC

## 2018-04-03 MED ORDER — PROPRANOLOL HCL 20 MG PO TABS: 20.0000 mg | ORAL_TABLET | Freq: Two times a day (BID) | ORAL | 1 refills | Status: DC | PRN

## 2018-04-03 NOTE — Progress Notes (Signed)
[name] [id number] 15-May-1980 38 y.o.  Subjective:   Patient ID:  Zoe Ryan is a 38 y.o. (DOB 12/19/1980) female.  Chief Complaint:  Chief Complaint  Patient presents with  . Follow-up    Medication Management  . Anxiety  . Depression    HPI last seen February 20, 2018 Zoe Ryan presents to the office today for follow-up of depression that failed to respond to lexapro plus Rexulti.  Changed to Cymbalta and Vraylar.  Found out that brother is on Vraylar and it helps him.  He suffers from Dep and anxiety.  At last visit duloxetine was increased from 60 to 90 mg daily for residual depression and anxiety.  She was having a partial response at that time to the combination of duloxetine 60 and Vraylar 1.5 mg daily which was started January 13, 2018.  A little better and a little worse.   Actually had a happy day and couldn't remember having had that before.  No reason.  Stress is worse and some anxiety.  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  Irritability with extra stress and responsibility.  "a little paranoid" wondering what people think of her. Tremor, unease, increased pulse.  More depressed than anxious on the weekend and not productive.  Watches TV and sleeps.    Better re: dep and anxiety after about 10 days.  Wasn't feeling nearly as incapacitated and anxity a little better.  Easier to leave the house.  Better productivity at home and felt more at ease.  Dep improved from 10/10 to 5/10 now.  Anxiety is still a 7/10.  Not having as many panic spells to 1/week..  Mostly generalized..  No mood swings.  Mood fairly stable.  Less overwhelmed.  Left work early 1 time.  Sleep about 6 hours at best and not good.  Sleeping on couch.  Problems going to bed.  Anxiety about going to bed and starting the next day so falls asleep on the couch.   Failed psychiatric medications include sertraline, Lexapro, paroxetine, fluoxetine,  venlafaxine, lithium, lamotrigine, valproic acid, Abilify, lithium, Rexulti  Review of Systems:  Review of Systems  Neurological: Negative for tremors and weakness.  Psychiatric/Behavioral: Positive for dysphoric mood and sleep disturbance. Negative for agitation, behavioral problems, confusion, decreased concentration, hallucinations, self-injury and suicidal ideas. The patient is nervous/anxious. The patient is not hyperactive.     Medications: I have reviewed the patient's current medications.  Current Outpatient Medications  Medication Sig Dispense Refill  . ALPRAZolam (XANAX XR) 1 MG 24 hr tablet Take 1 mg by mouth 3 (three) times daily. She has taken up to 5 daily    . ALPRAZolam (XANAX) 0.5 MG tablet Take 0.5 mg by mouth at bedtime as needed. Take 0.25 mg to 0.5 mg at bedtime as needed for anxiety     . Armodafinil (NUVIGIL) 250 MG tablet Take 250 mg by mouth.    Marland Kitchen buPROPion (WELLBUTRIN XL) 150 MG 24 hr tablet TAKE 3 TABLETS BY MOUTH EVERY MORNING (Patient taking differently: Take 150 mg by mouth daily. ) 270 tablet 0  . etonogestrel (NEXPLANON) 68 MG IMPL implant 1 each by Subdermal route once.    . fluticasone (FLONASE) 50 MCG/ACT nasal spray Place 2 sprays into both nostrils daily. 48 g 3  . Multiple Vitamin (MULITIVITAMIN WITH MINERALS) TABS Take 1 tablet by mouth daily.      [name] 20 MG TABS tablet TAKE 1 TABLET  BY MOUTH DAILY WITH SUPPER. 90 tablet 1  . zaleplon (SONATA) 10 MG capsule TAKE 1 CAPSULE BY MOUTH DAILY AT BEDTIME AS NEEDED FOR INSOMNIA  1  . fluconazole (DIFLUCAN) 150 MG tablet Take 1 tablet (150 mg total) by mouth once a week. (Patient not taking: Reported on 02/20/2018) 2 tablet 0  . metroNIDAZOLE (FLAGYL) 500 MG tablet Take 1 tablet (500 mg total) by mouth 2 (two) times daily with a meal. DO NOT CONSUME ALCOHOL WHILE TAKING THIS MEDICATION. (Patient not taking: Reported on 02/20/2018) 14 tablet 0  . VITAMIN D, ERGOCALCIFEROL, PO Take by mouth.     No current  facility-administered medications for this visit.     Medication Side Effects: None  Allergies: No Known Allergies  Past Medical History:  Diagnosis Date  . Anxiety   . Chronic shoulder pain   . Depression   . History of tobacco use    Quit 2013    Family History  Problem Relation Age of Onset  . Anxiety disorder Maternal Grandmother   . Mental retardation Maternal Grandmother   . Congestive Heart Failure Maternal Grandfather   . Cancer Maternal Grandfather        lymphoma  . Stroke Paternal Grandfather   . Mental illness Brother        depression  . Hyperlipidemia Brother     Social History   Socioeconomic History  . Marital status: Married    Spouse name: [name]  . Number of children: 0  . Years of education: College  . Highest education level: Not on file  Occupational History  . Occupation: Building control surveyor: [employer]  Social Needs  . Financial resource strain: Not on file  . Food insecurity:    Worry: Not on file    Inability: Not on file  . Transportation needs:    Medical: Not on file    Non-medical: Not on file  Tobacco Use  . Smoking status: Former Games developer  . Smokeless tobacco: Never Used  Substance and Sexual Activity  . Alcohol use: Yes    Alcohol/week: 11.0 standard drinks    Types: 11 Cans of beer per week  . Drug use: No  . Sexual activity: Never    Partners: Male    Comment: NuvaRing  Lifestyle  . Physical activity:    Days per week: Not on file    Minutes per session: Not on file  . Stress: Not on file  Relationships  . Social connections:    Talks on phone: Not on file    Gets together: Not on file    Attends religious service: Not on file    Active member of club or organization: Not on file    Attends meetings of clubs or organizations: Not on file    Relationship status: Not on file  . Intimate partner violence:    Fear of current or ex partner: Not on file    Emotionally abused: Not on file    Physically abused:  Not on file    Forced sexual activity: Not on file  Other Topics Concern  . Not on file  Social History Narrative   Lives with her husband.     Education: College   Exercise: Yes    Past Medical History, Surgical history, Social history, and Family history were reviewed and updated as appropriate.   Please see review of systems for further details on the patient's review from today.   Objective:  Physical Exam:  There were no vitals taken for this visit.  Physical Exam Constitutional:      General: She is not in acute distress.    Appearance: Normal appearance. She is well-developed.  Musculoskeletal:        General: No deformity.  Neurological:     Mental Status: She is alert and oriented to person, place, and time.     Motor: No tremor.     Coordination: Coordination normal.     Gait: Gait normal.  Psychiatric:        Attention and Perception: Attention and perception normal.        Mood and Affect: Mood is anxious and depressed. Affect is not labile, blunt, angry or inappropriate.        Speech: Speech normal.        Behavior: Behavior normal.        Thought Content: Thought content normal. Thought content does not include homicidal or suicidal ideation. Thought content does not include homicidal or suicidal plan.        Cognition and Memory: Cognition normal.        Judgment: Judgment normal.     Comments: Insight intact. No auditory or visual hallucinations. No delusions.      Lab Review:     Component Value Date/Time   NA 137 01/04/2017 1420   K 4.0 01/04/2017 1420   CL 98 01/04/2017 1420   CO2 23 01/04/2017 1420   GLUCOSE 86 01/04/2017 1420   GLUCOSE 76 07/08/2015 1440   BUN 6 01/04/2017 1420   CREATININE 0.71 01/04/2017 1420   CREATININE 0.66 07/08/2015 1440   CALCIUM 9.3 01/04/2017 1420   PROT 7.9 01/04/2017 1420   ALBUMIN 3.9 01/04/2017 1420   AST 22 01/04/2017 1420   ALT 26 01/04/2017 1420   ALKPHOS 99 01/04/2017 1420   BILITOT 0.2 01/04/2017  1420   GFRNONAA 111 01/04/2017 1420   GFRAA 128 01/04/2017 1420       Component Value Date/Time   WBC 7.7 01/04/2017 1420   WBC 6.4 07/08/2015 1440   RBC 4.48 01/04/2017 1420   RBC 4.38 07/08/2015 1440   HGB 14.3 01/04/2017 1420   HCT 40.9 01/04/2017 1420   PLT 363 01/04/2017 1420   MCV 91 01/04/2017 1420   MCH 31.9 01/04/2017 1420   MCH 32.4 07/08/2015 1440   MCHC 35.0 01/04/2017 1420   MCHC 34.4 07/08/2015 1440   RDW 14.3 01/04/2017 1420   LYMPHSABS 3.0 01/04/2017 1420   MONOABS 384 07/08/2015 1440   EOSABS 0.0 01/04/2017 1420   BASOSABS 0.0 01/04/2017 1420    No results found for: POCLITH, LITHIUM   No results found for: PHENYTOIN, PHENOBARB, VALPROATE, CBMZ   .res Assessment: Plan:    Major depressive disorder, recurrent episode, moderate (HCC)  Generalized anxiety disorder  Panic disorder with agoraphobia  Shift work sleep disorder   Partial response but is better than last visit.  Sx still moderately severe with dysfunction from both the depression and the anxiety.  I am very concerned about the increase and the Xanax that she is used.  We need to get the dosage back down to the prescribed dosage.  Discussed the addiction risk and withdrawal from that. We discussed the short-term risks associated with benzodiazepines including sedation and increased fall risk among others.  Discussed long-term side effect risk including dependence, potential withdrawal symptoms, and the potential eventual dose-related risk of dementia.   Disc SE Vraylar and long half life.  Consider increase or increase duloxetine.Disc SE.  Discussed potential metabolic side effects associated with atypical antipsychotics, as well as potential risk for movement side effects. Advised pt to contact office if movement side effects occur.   Increase duloxetine to 120 mg daily.  . Disc SE.  Also DT severity of sx increase Vraylar also to 3 mg daily because of the severity of her  symptoms.  Temporarily add propranolol for anxiety.  20-40 mg before work.  This is in hopes of allowing her to reduce the Xanax  Find out about brother's meds DT genetics.  She does not know this at this time.  Shift work managed with Nuvigil  This appt was 30 mins.  FU 1 month  Meredith Staggersarey Cottle, MD, DFAPA   Please see After Visit Summary for patient specific instructions.  No future appointments.  No orders of the defined types were placed in this encounter.     -------------------------------

## 2018-04-03 NOTE — Patient Instructions (Signed)
Increase Vraylar to 3 mg daily.  Increase duloxetine to 120 mg daily.  Add propranolol 20 to 40 mg every morning before work for anxiety.

## 2018-04-20 ENCOUNTER — Other Ambulatory Visit: Payer: Self-pay

## 2018-04-20 MED ORDER — ZALEPLON 10 MG PO CAPS: ORAL_CAPSULE | ORAL | 0 refills | Status: DC

## 2018-04-20 MED ORDER — ALPRAZOLAM 0.5 MG PO TABS: 0.5000 mg | ORAL_TABLET | Freq: Every evening | ORAL | 0 refills | Status: DC | PRN

## 2018-04-20 MED ORDER — ALPRAZOLAM ER 1 MG PO TB24: 1.0000 mg | ORAL_TABLET | Freq: Three times a day (TID) | ORAL | 0 refills | Status: DC

## 2018-04-24 ENCOUNTER — Telehealth: Payer: Self-pay | Admitting: Psychiatry

## 2018-04-24 NOTE — Telephone Encounter (Signed)
I increased her Cymbalta and Vraylar 3 weeks ago.  The increase in Cymbalta needs more time.  Further increases in Vraylar are unlikely to be helpful.  We also added propranolol to try to help with anxiety.  She is at the maximum dose of Xanax and we cannot go higher than that.  If she is having side effects she needs to let us know.  Otherwise, I am sorry but, I cannot think of any medication change that will help her in less than 2 weeks for her move.

## 2018-04-24 NOTE — Telephone Encounter (Signed)
Pt is moving in TWO weeks and she just wants to curl up in a ball instead of packing. States nothing is packed. She's worried she won't be able to afford her first home. It's affecting her work as well.   Last office visit 03/2018  Please advise   Uses [facility] outpt pharmacy if any changes.

## 2018-04-24 NOTE — Telephone Encounter (Signed)
Patient experiencing extreme anxiety. Stated it could be situational. Appt mid March, but need to speak with someone ASAP.Requesting a phone call today.

## 2018-04-25 NOTE — Telephone Encounter (Signed)
Left voice mail to call back 

## 2018-04-26 NOTE — Telephone Encounter (Signed)
Pt received information. Instructed to call back if symptoms worsen or any other problems. Verbalized understanding.

## 2018-05-02 ENCOUNTER — Telehealth: Payer: Self-pay | Admitting: Psychiatry

## 2018-05-02 NOTE — Telephone Encounter (Signed)
Given message and said it may be stress related, closing on a house this week as well. Verbalized understanding, didn't voice any other symptoms.

## 2018-05-02 NOTE — Telephone Encounter (Signed)
Zoe Ryan is taking Propranolol. She is asking if diarrhea is a side effect of the medicine. Not sure if its a side effect or if she is actually sick. Please advise.

## 2018-05-02 NOTE — Telephone Encounter (Signed)
Nausea is a possible side effect.  I do not see diarrhea listed.  She could stop it for 3 to 4 days and see if it resolves.  If it does resolve resume the propranolol.  If the diarrhea recurs then that will prove that propranolol is causing it.  Otherwise the assumption is it is not related

## 2018-05-04 ENCOUNTER — Telehealth: Payer: Self-pay | Admitting: Psychiatry

## 2018-05-04 NOTE — Telephone Encounter (Signed)
As far as I am concerned yes FMLA it is an option.  However the accommodations should be temporary as her goal should be resolving her symptoms.  We can help her with FMLA in the short-term until she gets fully better.

## 2018-05-04 NOTE — Telephone Encounter (Signed)
Zoe Ryan calls today saying she is experiencing more anxiety than usual and it is interfering with work, sometimes getting to work late and missing time. She asks if FMLA is an option.

## 2018-05-05 NOTE — Telephone Encounter (Signed)
Pt notified and states she will fax over paperwork today.

## 2018-05-11 ENCOUNTER — Other Ambulatory Visit: Payer: Self-pay | Admitting: General Practice

## 2018-05-11 NOTE — Telephone Encounter (Signed)
Patient called and advised she will need to establish with another provider at [facility] since Wallis Bamberg is no longer here. She says she has moved to [place] and asks if enough can be supplied to give her time to find another doctor, she says she is completely out of the medication.

## 2018-05-11 NOTE — Telephone Encounter (Signed)
Copied from CRM [id number]. Topic: Quick Communication - Rx Refill/Question >> May 11, 2018  5:27 PM Mcneil, Ja-Kwan wrote: Medication: XARELTO 20 MG TABS tablet  Has the patient contacted their pharmacy? yes   Preferred Pharmacy (with phone number or street name): [facility] - [address], [address] [phone] (Phone) [phone] (Fax)  Agent: Please be advised that RX refills may take up to 3 business days. We ask that you follow-up with your pharmacy.

## 2018-05-12 NOTE — Telephone Encounter (Signed)
Please advise  Patient is requesting a refill of the following medications: Requested Prescriptions   Pending Prescriptions Disp Refills  . rivaroxaban (XARELTO) 20 MG TABS tablet 90 tablet 1

## 2018-05-13 MED ORDER — RIVAROXABAN 20 MG PO TABS: ORAL_TABLET | ORAL | 1 refills | Status: DC

## 2018-05-13 NOTE — Telephone Encounter (Signed)
Please inform patient: LLE VTE in Jan 2019 while on nuvaring, started on xeralto Last OV April 06/2017 with Urban Gibson, PA-C Refilled for 30 days with 1 refill Needs to establish with new PCP in practice of choice

## 2018-05-15 NOTE — Telephone Encounter (Signed)
Call pt to establish with new PCP in practice of choice

## 2018-05-17 ENCOUNTER — Encounter: Payer: Self-pay | Admitting: Psychiatry

## 2018-05-17 ENCOUNTER — Ambulatory Visit (INDEPENDENT_AMBULATORY_CARE_PROVIDER_SITE_OTHER): Payer: No Typology Code available for payment source | Admitting: Psychiatry

## 2018-05-17 ENCOUNTER — Other Ambulatory Visit: Payer: Self-pay

## 2018-05-17 DIAGNOSIS — F4001 Agoraphobia with panic disorder: Secondary | ICD-10-CM | POA: Diagnosis not present

## 2018-05-17 DIAGNOSIS — F314 Bipolar disorder, current episode depressed, severe, without psychotic features: Secondary | ICD-10-CM

## 2018-05-17 DIAGNOSIS — G4726 Circadian rhythm sleep disorder, shift work type: Secondary | ICD-10-CM | POA: Diagnosis not present

## 2018-05-17 DIAGNOSIS — F411 Generalized anxiety disorder: Secondary | ICD-10-CM

## 2018-05-17 MED ORDER — CARIPRAZINE HCL 4.5 MG PO CAPS: 4.5000 mg | ORAL_CAPSULE | Freq: Every day | ORAL | 1 refills | Status: DC

## 2018-05-17 MED ORDER — DULOXETINE HCL 20 MG PO CPEP: 20.0000 mg | ORAL_CAPSULE | Freq: Every day | ORAL | 0 refills | Status: DC

## 2018-05-17 NOTE — Progress Notes (Signed)
Zoe Ryan [id number] Jan 12, 1981 38 y.o.  Subjective:   Patient ID:  Zoe Ryan is a 38 y.o. (DOB 08/18/1980) female.  Chief Complaint:  Chief Complaint  Patient presents with  . Follow-up    Medication Management  . Depression  . Anxiety  . Stress    HPI   Zoe Ryan presents to the office today for follow-up of depression that failed to respond to lexapro plus Rexulti.  Changed to Cymbalta and Vraylar.  Found out that brother is on Vraylar and it helps him.  He suffers from Dep and anxiety.  Patient was last seen April 03, 2018.  Due to the severity of her depression and anxiety duloxetine was increased to 120 and Vraylar was also increased to 3 mg being used off label for depression.  However patient mistakenly reduced duloxetine to 60 mg daily.  Also expressed concern about her increasing the Xanax use on her own and suggested use of propranolol in the morning as a way of reducing her anxiety before she goes to work.  She called back February 17 saying that she was not doing very well functionally with a lot of depression and anxiety.  Did more time and no meds were changed.  She called again on February 25 complaining of diarrhea and wondering if it was related to medication.  She called again on February 27 asking for FMLA.  Moved to [facility].  Diarrhea from stress for 2 1/2 weeks.  Depression and anxiety are not improved.  Feeling homesick for no reason.  Getting by at work.   Some absences and tardies and that's why wanted FMLA.  Written up 5 times and warning happens at 6.  When alone tends to think about being alone and not knowing where life is going.  No fear.  Watches TV and sleeps.    Propranolol did not help anxiety.  Still taking 3-5 of 1 mg Xanax daily without sleepiness.  Again disc questions about hypomania.  !0 years ago drinking a lot and hypersexual and increased energy.  No concerns from others experessed but when she started Wellbutrin.  Was starting a lot of  jigsaw puzzles.  Doesn't remember much else.  Dx bipolar in HS.  Thinks lamotrigine did help in combo with other meds.  Patient recently found out that her brother did poorly on duloxetine.  He is on Vraylar plus other medicines of which she is unaware.    Failed psychiatric medications include sertraline, Lexapro, paroxetine, fluoxetine, venlafaxine, lithium, lamotrigine doesn't remember why she stopped, valproic acid, Abilify, lithium, Rexulti, Wellbutrin 450, duloxetine 90.  Review of Systems:  Review of Systems  Constitutional: Positive for fatigue.  Neurological: Negative for tremors and weakness.  Psychiatric/Behavioral: Positive for dysphoric mood and sleep disturbance. Negative for agitation, behavioral problems, confusion, decreased concentration, hallucinations, self-injury and suicidal ideas. The patient is nervous/anxious. The patient is not hyperactive.     Medications: I have reviewed the patient's current medications.  Current Outpatient Medications  Medication Sig Dispense Refill  . ALPRAZolam (XANAX XR) 1 MG 24 hr tablet Take 1 tablet (1 mg total) by mouth 3 (three) times daily. She has taken up to 5 daily 270 tablet 0  . ALPRAZolam (XANAX) 0.5 MG tablet Take 1 tablet (0.5 mg total) by mouth at bedtime as needed. Take 0.25 mg to 0.5 mg at bedtime as needed for anxiety 90 tablet 0  . Armodafinil (NUVIGIL) 250 MG tablet Take 250 mg by mouth.    Marland Kitchen buPROPion (WELLBUTRIN XL)  150 MG 24 hr tablet TAKE 3 TABLETS BY MOUTH EVERY MORNING (Patient taking differently: Take 150 mg by mouth daily. ) 270 tablet 0  . DULoxetine (CYMBALTA) 60 MG capsule Take 60 mg by mouth daily.    Marland Kitchen etonogestrel (NEXPLANON) 68 MG IMPL implant 1 each by Subdermal route once.    . fluticasone (FLONASE) 50 MCG/ACT nasal spray Place 2 sprays into both nostrils daily. 48 g 3  . Multiple Vitamin (MULITIVITAMIN WITH MINERALS) TABS Take 1 tablet by mouth daily.      . propranolol (INDERAL) 20 MG tablet Take 1-2  tablets (20-40 mg total) by mouth 2 (two) times daily as needed. 120 tablet 1  . rivaroxaban (XARELTO) 20 MG TABS tablet TAKE 1 TABLET BY MOUTH DAILY WITH SUPPER. 30 tablet 1  . zaleplon (SONATA) 10 MG capsule Take 1 capsule (10 mg) by mouth at bedtime as needed. 90 capsule 0  . Cariprazine HCl (VRAYLAR) 4.5 MG CAPS Take 1 capsule (4.5 mg total) by mouth daily. 30 capsule 1   No current facility-administered medications for this visit.     Medication Side Effects: None  Allergies: No Known Allergies  Past Medical History:  Diagnosis Date  . Anxiety   . Chronic shoulder pain   . Depression   . History of tobacco use    Quit 2013    Family History  Problem Relation Age of Onset  . Anxiety disorder Maternal Grandmother   . Mental retardation Maternal Grandmother   . Congestive Heart Failure Maternal Grandfather   . Cancer Maternal Grandfather        lymphoma  . Stroke Paternal Grandfather   . Mental illness Brother        depression  . Hyperlipidemia Brother     Social History   Socioeconomic History  . Marital status: Married    Spouse name: [name]  . Number of children: 0  . Years of education: College  . Highest education level: Not on file  Occupational History  . Occupation: Building control surveyor: [employer]  Social Needs  . Financial resource strain: Not on file  . Food insecurity:    Worry: Not on file    Inability: Not on file  . Transportation needs:    Medical: Not on file    Non-medical: Not on file  Tobacco Use  . Smoking status: Former Games developer  . Smokeless tobacco: Never Used  Substance and Sexual Activity  . Alcohol use: Yes    Alcohol/week: 11.0 standard drinks    Types: 11 Cans of beer per week  . Drug use: No  . Sexual activity: Never    Partners: Male    Comment: NuvaRing  Lifestyle  . Physical activity:    Days per week: Not on file    Minutes per session: Not on file  . Stress: Not on file  Relationships  . Social connections:     Talks on phone: Not on file    Gets together: Not on file    Attends religious service: Not on file    Active member of club or organization: Not on file    Attends meetings of clubs or organizations: Not on file    Relationship status: Not on file  . Intimate partner violence:    Fear of current or ex partner: Not on file    Emotionally abused: Not on file    Physically abused: Not on file    Forced sexual activity: Not on  file  Other Topics Concern  . Not on file  Social History Narrative   Lives with her husband.     Education: College   Exercise: Yes    Past Medical History, Surgical history, Social history, and Family history were reviewed and updated as appropriate.   Please see review of systems for further details on the patient's review from today.   Objective:   Physical Exam:  There were no vitals taken for this visit.  Physical Exam Constitutional:      General: She is not in acute distress.    Appearance: Normal appearance. She is well-developed.  Musculoskeletal:        General: No deformity.  Neurological:     Mental Status: She is alert and oriented to person, place, and time.     Motor: No tremor.     Coordination: Coordination normal.     Gait: Gait normal.  Psychiatric:        Attention and Perception: Attention and perception normal.        Mood and Affect: Mood is anxious and depressed. Affect is blunt. Affect is not labile, angry or inappropriate.        Speech: Speech normal.        Behavior: Behavior normal.        Thought Content: Thought content normal. Thought content does not include homicidal or suicidal ideation. Thought content does not include homicidal or suicidal plan.        Cognition and Memory: Cognition normal.        Judgment: Judgment normal.     Comments: Insight intact. No auditory or visual hallucinations. No delusions.      Lab Review:     Component Value Date/Time   NA 137 01/04/2017 1420   K 4.0 01/04/2017 1420    CL 98 01/04/2017 1420   CO2 23 01/04/2017 1420   GLUCOSE 86 01/04/2017 1420   GLUCOSE 76 07/08/2015 1440   BUN 6 01/04/2017 1420   CREATININE 0.71 01/04/2017 1420   CREATININE 0.66 07/08/2015 1440   CALCIUM 9.3 01/04/2017 1420   PROT 7.9 01/04/2017 1420   ALBUMIN 3.9 01/04/2017 1420   AST 22 01/04/2017 1420   ALT 26 01/04/2017 1420   ALKPHOS 99 01/04/2017 1420   BILITOT 0.2 01/04/2017 1420   GFRNONAA 111 01/04/2017 1420   GFRAA 128 01/04/2017 1420       Component Value Date/Time   WBC 7.7 01/04/2017 1420   WBC 6.4 07/08/2015 1440   RBC 4.48 01/04/2017 1420   RBC 4.38 07/08/2015 1440   HGB 14.3 01/04/2017 1420   HCT 40.9 01/04/2017 1420   PLT 363 01/04/2017 1420   MCV 91 01/04/2017 1420   MCH 31.9 01/04/2017 1420   MCH 32.4 07/08/2015 1440   MCHC 35.0 01/04/2017 1420   MCHC 34.4 07/08/2015 1440   RDW 14.3 01/04/2017 1420   LYMPHSABS 3.0 01/04/2017 1420   MONOABS 384 07/08/2015 1440   EOSABS 0.0 01/04/2017 1420   BASOSABS 0.0 01/04/2017 1420    No results found for: POCLITH, LITHIUM   No results found for: PHENYTOIN, PHENOBARB, VALPROATE, CBMZ   .res Assessment: Plan:    Severe bipolar I disorder, current or most recent episode depressed (HCC) - Plan: Cariprazine HCl (VRAYLAR) 4.5 MG CAPS, DISCONTINUED: DULoxetine (CYMBALTA) 20 MG capsule  Generalized anxiety disorder - Plan: DISCONTINUED: DULoxetine (CYMBALTA) 20 MG capsule  Panic disorder with agoraphobia - Plan: DISCONTINUED: DULoxetine (CYMBALTA) 20 MG capsule  Shift work sleep disorder  Patient increase the Vraylar as instructed to 4.5 mg and did not see additional improvement she did not increase the duloxetine as instructed and is in fact taking 60 mg daily.  Sx still moderately severe with dysfunction from both the depression and the anxiety.  I am very concerned about the increase and the Xanax that she is used.  We need to get the dosage back down to the prescribed dosage.  Discussed the addiction  risk and withdrawal from that. We discussed the short-term risks associated with benzodiazepines including sedation and increased fall risk among others.  Discussed long-term side effect risk including dependence, potential withdrawal symptoms, and the potential eventual dose-related risk of dementia.  She has treatment resistant depression.  We discussed the various causes of this 1 of which is unrecognized bipolar depression.  Upon further questioning about her past psychiatric history she does acknowledge a.  Of at least hypomania in the past.  She was also diagnosed as bipolar while a teenager.  Change in diagnosis to bipolar depression.  Option retry Lamotrigine or try Latuda.,  Wean duloxetine or increase Vraylar.  She is tolerating the Vraylar.  Her brother did poorly on duloxetine and she has not seen much improvement at up to 90 mg daily.  Will wean duloxetine  Disc SE Vraylar and long half life.  Consider increase or increase duloxetine.Disc SE.  Discussed potential metabolic side effects associated with atypical antipsychotics, as well as potential risk for movement side effects. Advised pt to contact office if movement side effects occur.   She did not see a positive response from propranolol but it could be considered again at a higher dosage for her severe anxiety.  Shift work managed with [employer] FMLA forms for intermittent.  This appt was 30 mins.  FU 2 weeks  Meredith Staggers, MD, DFAPA   Please see After Visit Summary for patient specific instructions.  Future Appointments  Date Time Provider Department Center  06/07/2018 11:45 AM Cottle, Steva Ready., MD [facility] None    No orders of the defined types were placed in this encounter.     -------------------------------

## 2018-05-17 NOTE — Patient Instructions (Signed)
Increase Vraylar to 4.5 mg daily.  Reduce duloxetine (Cymbalta)  to 30 mg daily.

## 2018-06-07 ENCOUNTER — Encounter: Payer: Self-pay | Admitting: Psychiatry

## 2018-06-07 ENCOUNTER — Ambulatory Visit (INDEPENDENT_AMBULATORY_CARE_PROVIDER_SITE_OTHER): Payer: PRIVATE HEALTH INSURANCE | Admitting: Psychiatry

## 2018-06-07 DIAGNOSIS — F314 Bipolar disorder, current episode depressed, severe, without psychotic features: Secondary | ICD-10-CM

## 2018-06-07 DIAGNOSIS — G4726 Circadian rhythm sleep disorder, shift work type: Secondary | ICD-10-CM | POA: Diagnosis not present

## 2018-06-07 DIAGNOSIS — F411 Generalized anxiety disorder: Secondary | ICD-10-CM

## 2018-06-07 DIAGNOSIS — F4001 Agoraphobia with panic disorder: Secondary | ICD-10-CM

## 2018-06-07 MED ORDER — SERTRALINE HCL 50 MG PO TABS: ORAL_TABLET | ORAL | 1 refills | Status: DC

## 2018-06-07 NOTE — Progress Notes (Signed)
Reganne Messerschmidt [id number] 03-07-1981 38 y.o.  Subjective:   Patient ID:  Zoe Ryan is a 38 y.o. (DOB 1980/08/13) female.  Chief Complaint:  Chief Complaint  Patient presents with  . Depression  . Anxiety  . Follow-up    Med changes    HPI   Zoe Ryan is phoned today urgently because of recent severe depressive and anxiety symptoms that have been interfering with her ability to work.  Patient was last seen only about 3 weeks ago.  At that time her diagnosis was changed to bipolar depression.  She weaned off duloxetine.  Her brother had been treated with it and did not do well with it.  I think i'm doing better. Virus is anxiety producing.  Pt reports that mood is Anxious, Depressed and anxiety is worse than depression and describes anxiety as Moderate. Anxiety symptoms include: Excessive Worry, Panic Symptoms, couple panic per week usually at work or before.. She has high stress job.  Pt reports no sleep issues and better hygiene. 8 hours.. Pt reports that appetite is good. Pt reports that energy is lethargic and no change, poor motivation and withdrawn from usual activities. Concentration is down slightly. Suicidal thoughts:  denied by patient.  She is sometimes taking 4 Xanax XR instead of the 3 prescribed.  In addition she is taking short acting Xanax occasionally for panic and also for sleep.  She is barely functioning at work.  She says the propranolol is not very helpful for anxiety.   She called again on February 27 asking for FMLA.  At the visit in March we again disc questions about hypomania.  !0 years ago drinking a lot and hypersexual and increased energy.  No concerns from others experessed but when she started Wellbutrin.  Was starting a lot of jigsaw puzzles.  Doesn't remember much else.  Dx bipolar in HS.  Thinks lamotrigine did help in combo with other meds.  Patient recently found out that her brother did poorly on duloxetine.  He is on Vraylar plus other medicines  of which she is unaware.    Failed psychiatric medications include sertraline, Lexapro, paroxetine, fluoxetine, venlafaxine, lithium, lamotrigine doesn't remember why she stopped, valproic acid, Abilify, lithium, Rexulti, Wellbutrin 450, duloxetine 90.failed to respond to lexapro plus Rexulti  Review of Systems:  Review of Systems  Constitutional: Positive for fatigue.  Genitourinary:       Stress diarrhea   Neurological: Negative for tremors and weakness.  Psychiatric/Behavioral: Positive for dysphoric mood and sleep disturbance. Negative for agitation, behavioral problems, confusion, decreased concentration, hallucinations, self-injury and suicidal ideas. The patient is nervous/anxious. The patient is not hyperactive.     Medications: I have reviewed the patient's current medications.  Current Outpatient Medications  Medication Sig Dispense Refill  . ALPRAZolam (XANAX XR) 1 MG 24 hr tablet Take 1 tablet (1 mg total) by mouth 3 (three) times daily. She has taken up to 5 daily 270 tablet 0  . ALPRAZolam (XANAX) 0.5 MG tablet Take 1 tablet (0.5 mg total) by mouth at bedtime as needed. Take 0.25 mg to 0.5 mg at bedtime as needed for anxiety 90 tablet 0  . Armodafinil (NUVIGIL) 250 MG tablet Take 250 mg by mouth.    Marland Kitchen buPROPion (WELLBUTRIN XL) 150 MG 24 hr tablet Take 150 mg by mouth daily.    . Cariprazine HCl (VRAYLAR) 4.5 MG CAPS Take 1 capsule (4.5 mg total) by mouth daily. 30 capsule 1  . etonogestrel (NEXPLANON) 68 MG IMPL implant 1  each by Subdermal route once.    . fluticasone (FLONASE) 50 MCG/ACT nasal spray Place 2 sprays into both nostrils daily. 48 g 3  . rivaroxaban (XARELTO) 20 MG TABS tablet TAKE 1 TABLET BY MOUTH DAILY WITH SUPPER. 30 tablet 1  . zaleplon (SONATA) 10 MG capsule Take 1 capsule (10 mg) by mouth at bedtime as needed. 90 capsule 0  . Multiple Vitamin (MULITIVITAMIN WITH MINERALS) TABS Take 1 tablet by mouth daily.      . propranolol (INDERAL) 20 MG tablet Take  1-2 tablets (20-40 mg total) by mouth 2 (two) times daily as needed. (Patient not taking: Reported on 06/07/2018) 120 tablet 1  . sertraline (ZOLOFT) 50 MG tablet Start 1/2 tablet daily for 1 week then 1 tablet daily 30 tablet 1   No current facility-administered medications for this visit.     Medication Side Effects: None  Allergies: No Known Allergies  Past Medical History:  Diagnosis Date  . Anxiety   . Chronic shoulder pain   . Depression   . History of tobacco use    Quit 2013    Family History  Problem Relation Age of Onset  . Anxiety disorder Maternal Grandmother   . Mental retardation Maternal Grandmother   . Congestive Heart Failure Maternal Grandfather   . Cancer Maternal Grandfather        lymphoma  . Stroke Paternal Grandfather   . Mental illness Brother        depression  . Hyperlipidemia Brother     Social History   Socioeconomic History  . Marital status: Married    Spouse name: [name]  . Number of children: 0  . Years of education: College  . Highest education level: Not on file  Occupational History  . Occupation: Building control surveyor: [employer]  Social Needs  . Financial resource strain: Not on file  . Food insecurity:    Worry: Not on file    Inability: Not on file  . Transportation needs:    Medical: Not on file    Non-medical: Not on file  Tobacco Use  . Smoking status: Former Games developer  . Smokeless tobacco: Never Used  Substance and Sexual Activity  . Alcohol use: Yes    Alcohol/week: 11.0 standard drinks    Types: 11 Cans of beer per week  . Drug use: No  . Sexual activity: Never    Partners: Male    Comment: NuvaRing  Lifestyle  . Physical activity:    Days per week: Not on file    Minutes per session: Not on file  . Stress: Not on file  Relationships  . Social connections:    Talks on phone: Not on file    Gets together: Not on file    Attends religious service: Not on file    Active member of club or organization: Not  on file    Attends meetings of clubs or organizations: Not on file    Relationship status: Not on file  . Intimate partner violence:    Fear of current or ex partner: Not on file    Emotionally abused: Not on file    Physically abused: Not on file    Forced sexual activity: Not on file  Other Topics Concern  . Not on file  Social History Narrative   Lives with her husband.     Education: College   Exercise: Yes    Past Medical History, Surgical history, Social history, and Family  history were reviewed and updated as appropriate.   Please see review of systems for further details on the patient's review from today.   Objective:   Physical Exam:  There were no vitals taken for this visit.  Physical Exam Neurological:     Mental Status: She is alert and oriented to person, place, and time.     Cranial Nerves: No dysarthria.  Psychiatric:        Attention and Perception: Attention normal.        Mood and Affect: Mood is anxious and depressed.        Speech: Speech normal.        Behavior: Behavior is cooperative.        Thought Content: Thought content normal. Thought content is not paranoid or delusional. Thought content does not include homicidal or suicidal ideation. Thought content does not include homicidal or suicidal plan.        Cognition and Memory: Cognition and memory normal.        Judgment: Judgment normal.     Lab Review:     Component Value Date/Time   NA 137 01/04/2017 1420   K 4.0 01/04/2017 1420   CL 98 01/04/2017 1420   CO2 23 01/04/2017 1420   GLUCOSE 86 01/04/2017 1420   GLUCOSE 76 07/08/2015 1440   BUN 6 01/04/2017 1420   CREATININE 0.71 01/04/2017 1420   CREATININE 0.66 07/08/2015 1440   CALCIUM 9.3 01/04/2017 1420   PROT 7.9 01/04/2017 1420   ALBUMIN 3.9 01/04/2017 1420   AST 22 01/04/2017 1420   ALT 26 01/04/2017 1420   ALKPHOS 99 01/04/2017 1420   BILITOT 0.2 01/04/2017 1420   GFRNONAA 111 01/04/2017 1420   GFRAA 128 01/04/2017 1420        Component Value Date/Time   WBC 7.7 01/04/2017 1420   WBC 6.4 07/08/2015 1440   RBC 4.48 01/04/2017 1420   RBC 4.38 07/08/2015 1440   HGB 14.3 01/04/2017 1420   HCT 40.9 01/04/2017 1420   PLT 363 01/04/2017 1420   MCV 91 01/04/2017 1420   MCH 31.9 01/04/2017 1420   MCH 32.4 07/08/2015 1440   MCHC 35.0 01/04/2017 1420   MCHC 34.4 07/08/2015 1440   RDW 14.3 01/04/2017 1420   LYMPHSABS 3.0 01/04/2017 1420   MONOABS 384 07/08/2015 1440   EOSABS 0.0 01/04/2017 1420   BASOSABS 0.0 01/04/2017 1420    No results found for: POCLITH, LITHIUM   No results found for: PHENYTOIN, PHENOBARB, VALPROATE, CBMZ   .res Assessment: Plan:    Severe bipolar I disorder, current or most recent episode depressed (HCC)  Generalized anxiety disorder  Panic disorder with agoraphobia  Shift work sleep disorder   Delight has not had significant success changes in her depression and anxiety since weaning off the duloxetine after being diagnosed with bipolar depression instead of major depression.  Her depression and anxiety are treatment resistant.  Sx still moderately severe with dysfunction from both the depression and the anxiety.  I am very concerned about the increase and the Xanax that she is used.  We need to get the dosage back down to the prescribed dosage.  Discussed the addiction risk and withdrawal from that. We discussed the short-term risks associated with benzodiazepines including sedation and increased fall risk among others.  Discussed long-term side effect risk including dependence, potential withdrawal symptoms, and the potential eventual dose-related risk of dementia.  However her anxiety is also significant.  We discussed the risk that SSRIs  pose in bipolar patients.  However they are the preferred agents for panic attack prevention.  We will try a low dosage of sertraline 50 mg in hopes of blocking the panic and she will let us know if it causes mood swings.  She has treatment  resistant depression.   Option retry Lamotrigine or try Latuda or increase the Vraylar or a trial of lithium... She is tolerating the Vraylar.  It seems more efficient to increase the Vraylar to 6 mg daily because she has 20 days worth of medication and can do that.  Discussed side effects.  She agrees with this plan.  Discussed potential metabolic side effects associated with atypical antipsychotics, as well as potential risk for movement side effects. Advised pt to contact office if movement side effects occur.   She did not see a positive response from propranolol but it could be considered again at a higher dosage for her severe anxiety.  Shift work managed with [employer] FMLA forms for intermittent.  This appt was 30 mins.  FU 4 weeks  I connected with patient by a video enabled telemedicine application or telephone, with their informed consent, and verified patient privacy and that I am speaking with the correct person using two identifiers.  I was located at office and patient at home.    Meredith Staggers, MD, DFAPA   Please see After Visit Summary for patient specific instructions.  No future appointments.  No orders of the defined types were placed in this encounter.     -------------------------------

## 2018-06-22 ENCOUNTER — Other Ambulatory Visit: Payer: Self-pay

## 2018-06-22 MED ORDER — ARMODAFINIL 250 MG PO TABS: 250.0000 mg | ORAL_TABLET | Freq: Every day | ORAL | 0 refills | Status: DC

## 2018-07-13 ENCOUNTER — Other Ambulatory Visit: Payer: Self-pay

## 2018-07-13 ENCOUNTER — Encounter: Payer: Self-pay | Admitting: Psychiatry

## 2018-07-13 ENCOUNTER — Ambulatory Visit (INDEPENDENT_AMBULATORY_CARE_PROVIDER_SITE_OTHER): Payer: No Typology Code available for payment source | Admitting: Psychiatry

## 2018-07-13 DIAGNOSIS — F411 Generalized anxiety disorder: Secondary | ICD-10-CM | POA: Diagnosis not present

## 2018-07-13 DIAGNOSIS — G4726 Circadian rhythm sleep disorder, shift work type: Secondary | ICD-10-CM

## 2018-07-13 DIAGNOSIS — F4001 Agoraphobia with panic disorder: Secondary | ICD-10-CM | POA: Diagnosis not present

## 2018-07-13 DIAGNOSIS — F314 Bipolar disorder, current episode depressed, severe, without psychotic features: Secondary | ICD-10-CM | POA: Diagnosis not present

## 2018-07-13 NOTE — Progress Notes (Signed)
Zoe Ryan [id number] 01-06-1981 37 y.o.   Virtual Visit via Telephone Note  I connected with pt by telephone and verified that I am speaking with the correct person using two identifiers.   I discussed the limitations, risks, security and privacy concerns of performing an evaluation and management service by telephone and the availability of in person appointments. I also discussed with the patient that there may be a patient responsible charge related to this service. The patient expressed understanding and agreed to proceed.  I discussed the assessment and treatment plan with the patient. The patient was provided an opportunity to ask questions and all were answered. The patient agreed with the plan and demonstrated an understanding of the instructions.   The patient was advised to call back or seek an in-person evaluation if the symptoms worsen or if the condition fails to improve as anticipated.  I provided 23 minutes of non-face-to-face time during this encounter. The call started at 10:00 and ended at 1023. The patient was located at home and the provider was located office.   Subjective:   Patient ID:  Zoe Ryan is a 38 y.o. (DOB 1980-05-22) female.  Chief Complaint:  Chief Complaint  Patient presents with  . Follow-up    Medication management  . Anxiety    Medication management  . Depression    Medication management    Anxiety  Symptoms include nervous/anxious behavior. Patient reports no confusion, decreased concentration or suicidal ideas.    Depression         Associated symptoms include fatigue.  Associated symptoms include no decreased concentration and no suicidal ideas.  Past medical history includes anxiety.      Zoe Ryan is phoned today urgently because of recent severe TR bipolar depressive and anxiety symptoms that have been interfering with her ability to work.  Last visit 06/07/2018.  A low dosage of sertraline 50 mg was added for panic and anxiety  symptoms.  In addition Vraylar was increased to 6 mg a day for treatment resistant bipolar depression.  Better.  I day of actually feeling happy.  Gradual improvement since last visit. Depression 4/10 now and 7/10 last time.  Anxiety 5/10 and was 8i/10 last time.  Panic lessened in frequency to 1-2 week and less severe and fairly manageable.  Normally triggered over going to work.  Intermittent FMLA and hasn't missed any days since laast visit.  Good work function.  Sometimes feels a little scattered.  Sticking with 3 daily of Xanax XR 1 mg.  Xanax 0.5 about 4 times per week. This is better.  Sleep good with more disciplined schedule helped.  A few mood swings with anger but managed.  Lasting a few hours to a couple of days.  Sleep ok through them.  Relaxation exercises help.  In March her diagnosis was changed to bipolar depression.  She weaned off duloxetine.  Her brother had been treated with it and did not do well with it.  I think i'm doing better. Virus is anxiety producing.  Pt reports that mood is Anxious, Depressed and anxiety is worse than depression and describes anxiety as Moderate. Anxiety symptoms include: Excessive Worry, Panic Symptoms, couple panic per week usually at work or before.. She has high stress job.  Pt reports no sleep issues and better hygiene. 8 hours.. Pt reports that appetite is good. Pt reports that energy is lethargic and no change, poor motivation and withdrawn from usual activities. Concentration is down slightly. Suicidal thoughts:  denied  by patient.   She says the propranolol is not very helpful for anxiety, but taken it once or twice for tremor with anxiety.  She called again on February 27 asking for FMLA.  At the visit in March we again disc questions about hypomania.  !0 years ago drinking a lot and hypersexual and increased energy.  No concerns from others experessed but when she started Wellbutrin.  Was starting a lot of jigsaw puzzles.  Doesn't  remember much else.  Dx bipolar in HS.  Thinks lamotrigine did help in combo with other meds.  Patient recently found out that her brother did poorly on duloxetine.  He is on Vraylar plus other medicines of which she is unaware.    Failed psychiatric medications include sertraline, Lexapro, paroxetine, fluoxetine, venlafaxine, lithium, lamotrigine doesn't remember why she stopped, valproic acid, Abilify, lithium, Rexulti, Wellbutrin 450, duloxetine 90.failed to respond to lexapro plus Rexulti  Review of Systems:  Review of Systems  Constitutional: Positive for fatigue.  Genitourinary:       Stress diarrhea   Neurological: Negative for tremors and weakness.  Psychiatric/Behavioral: Positive for depression, dysphoric mood and sleep disturbance. Negative for agitation, behavioral problems, confusion, decreased concentration, hallucinations, self-injury and suicidal ideas. The patient is nervous/anxious. The patient is not hyperactive.     Medications: I have reviewed the patient's current medications.  Current Outpatient Medications  Medication Sig Dispense Refill  . ALPRAZolam (XANAX XR) 1 MG 24 hr tablet Take 1 tablet (1 mg total) by mouth 3 (three) times daily. She has taken up to 5 daily 270 tablet 0  . ALPRAZolam (XANAX) 0.5 MG tablet Take 1 tablet (0.5 mg total) by mouth at bedtime as needed. Take 0.25 mg to 0.5 mg at bedtime as needed for anxiety 90 tablet 0  . Armodafinil (NUVIGIL) 250 MG tablet Take 1 tablet (250 mg total) by mouth daily. 90 tablet 0  . buPROPion (WELLBUTRIN XL) 150 MG 24 hr tablet Take 150 mg by mouth daily.    . Cariprazine HCl (VRAYLAR) 6 MG CAPS Take 6 mg by mouth daily.    Marland Kitchen etonogestrel (NEXPLANON) 68 MG IMPL implant 1 each by Subdermal route once.    . fluticasone (FLONASE) 50 MCG/ACT nasal spray Place 2 sprays into both nostrils daily. 48 g 3  . Multiple Vitamin (MULITIVITAMIN WITH MINERALS) TABS Take 1 tablet by mouth daily.      . sertraline (ZOLOFT) 50  MG tablet Start 1/2 tablet daily for 1 week then 1 tablet daily 30 tablet 1  . zaleplon (SONATA) 10 MG capsule Take 1 capsule (10 mg) by mouth at bedtime as needed. 90 capsule 0   No current facility-administered medications for this visit.     Medication Side Effects: None  Allergies: No Known Allergies  Past Medical History:  Diagnosis Date  . Anxiety   . Chronic shoulder pain   . Depression   . History of tobacco use    Quit 2013    Family History  Problem Relation Age of Onset  . Anxiety disorder Maternal Grandmother   . Mental retardation Maternal Grandmother   . Congestive Heart Failure Maternal Grandfather   . Cancer Maternal Grandfather        lymphoma  . Stroke Paternal Grandfather   . Mental illness Brother        depression  . Hyperlipidemia Brother     Social History   Socioeconomic History  . Marital status: Married    Spouse name: [name]  .  Number of children: 0  . Years of education: College  . Highest education level: Not on file  Occupational History  . Occupation: Building control surveyorlab tech    Employer: [employer]  Social Needs  . Financial resource strain: Not on file  . Food insecurity:    Worry: Not on file    Inability: Not on file  . Transportation needs:    Medical: Not on file    Non-medical: Not on file  Tobacco Use  . Smoking status: Former Games developermoker  . Smokeless tobacco: Never Used  Substance and Sexual Activity  . Alcohol use: Yes    Alcohol/week: 11.0 standard drinks    Types: 11 Cans of beer per week  . Drug use: No  . Sexual activity: Never    Partners: Male    Comment: NuvaRing  Lifestyle  . Physical activity:    Days per week: Not on file    Minutes per session: Not on file  . Stress: Not on file  Relationships  . Social connections:    Talks on phone: Not on file    Gets together: Not on file    Attends religious service: Not on file    Active member of club or organization: Not on file    Attends meetings of clubs or  organizations: Not on file    Relationship status: Not on file  . Intimate partner violence:    Fear of current or ex partner: Not on file    Emotionally abused: Not on file    Physically abused: Not on file    Forced sexual activity: Not on file  Other Topics Concern  . Not on file  Social History Narrative   Lives with her husband.     Education: College   Exercise: Yes    Past Medical History, Surgical history, Social history, and Family history were reviewed and updated as appropriate.   Please see review of systems for further details on the patient's review from today.   Objective:   Physical Exam:  There were no vitals taken for this visit.  Physical Exam Neurological:     Mental Status: She is alert and oriented to person, place, and time.     Cranial Nerves: No dysarthria.  Psychiatric:        Attention and Perception: Attention normal.        Mood and Affect: Mood is anxious and depressed.        Speech: Speech normal.        Behavior: Behavior is cooperative.        Thought Content: Thought content normal. Thought content is not paranoid or delusional. Thought content does not include homicidal or suicidal ideation. Thought content does not include homicidal or suicidal plan.        Cognition and Memory: Cognition and memory normal.        Judgment: Judgment normal.     Lab Review:     Component Value Date/Time   NA 137 01/04/2017 1420   K 4.0 01/04/2017 1420   CL 98 01/04/2017 1420   CO2 23 01/04/2017 1420   GLUCOSE 86 01/04/2017 1420   GLUCOSE 76 07/08/2015 1440   BUN 6 01/04/2017 1420   CREATININE 0.71 01/04/2017 1420   CREATININE 0.66 07/08/2015 1440   CALCIUM 9.3 01/04/2017 1420   PROT 7.9 01/04/2017 1420   ALBUMIN 3.9 01/04/2017 1420   AST 22 01/04/2017 1420   ALT 26 01/04/2017 1420   ALKPHOS 99 01/04/2017 1420  BILITOT 0.2 01/04/2017 1420   GFRNONAA 111 01/04/2017 1420   GFRAA 128 01/04/2017 1420       Component Value Date/Time   WBC  7.7 01/04/2017 1420   WBC 6.4 07/08/2015 1440   RBC 4.48 01/04/2017 1420   RBC 4.38 07/08/2015 1440   HGB 14.3 01/04/2017 1420   HCT 40.9 01/04/2017 1420   PLT 363 01/04/2017 1420   MCV 91 01/04/2017 1420   MCH 31.9 01/04/2017 1420   MCH 32.4 07/08/2015 1440   MCHC 35.0 01/04/2017 1420   MCHC 34.4 07/08/2015 1440   RDW 14.3 01/04/2017 1420   LYMPHSABS 3.0 01/04/2017 1420   MONOABS 384 07/08/2015 1440   EOSABS 0.0 01/04/2017 1420   BASOSABS 0.0 01/04/2017 1420    No results found for: POCLITH, LITHIUM   No results found for: PHENYTOIN, PHENOBARB, VALPROATE, CBMZ   .res Assessment: Plan:    Severe bipolar I disorder, current or most recent episode depressed (HCC)  Generalized anxiety disorder  Panic disorder with agoraphobia  Shift work sleep disorder   Zoe Ryan has seen improvement with the med changes at the last visit increasing Vraylar to 6 mg a day and adding sertraline 50 mg for anxiety including panic disorder.  She still having panic attacks and significant depression but it is gradually improving.  She is tolerating the medications well..  Her depression and anxiety are treatment resistant.  Symptoms have improved from moderately severe to moderate.    I was very concerned about the increase in the Xanax that she had used prior to the last visit.Marland Kitchen  She was able to get the dosage back down to the prescribed dosage.  Discussed the addiction risk and withdrawal from that. We discussed the short-term risks associated with benzodiazepines including sedation and increased fall risk among others.  Discussed long-term side effect risk including dependence, potential withdrawal symptoms, and the potential eventual dose-related risk of dementia.  However her anxiety is also significant.  Continue low dosage of sertraline 50 mg in hopes of blocking the panic and she will let us know if it causes mood swings.  She has treatment resistant depression.   Option retry Lamotrigine or try  Latuda or increase the Vraylar or a trial of lithium... She is tolerating the Vraylar.  I continue Vraylar to 6 mg daily because she has seen some improvement in the depression.  Discussed side effects.  She agrees with this plan.  Discussed potential metabolic side effects associated with atypical antipsychotics, as well as potential risk for movement side effects. Advised pt to contact office if movement side effects occur.   She did not see a positive response from propranolol but it could be considered again at a higher dosage for her severe anxiety.  Shift work managed with Nuvigil  FU 4 weeks  Meredith Staggers, MD, DFAPA   Please see After Visit Summary for patient specific instructions.  No future appointments.  No orders of the defined types were placed in this encounter.     -------------------------------

## 2018-07-17 ENCOUNTER — Other Ambulatory Visit: Payer: Self-pay | Admitting: Psychiatry

## 2018-07-17 DIAGNOSIS — F314 Bipolar disorder, current episode depressed, severe, without psychotic features: Secondary | ICD-10-CM

## 2018-07-19 NOTE — Telephone Encounter (Signed)
Last fills for controlled 02/13 90 day  Clarify Wellbutrin, epic shows 1 tablet daily, refill is 3 daily?

## 2018-07-20 ENCOUNTER — Other Ambulatory Visit: Payer: Self-pay | Admitting: Psychiatry

## 2018-07-20 DIAGNOSIS — F314 Bipolar disorder, current episode depressed, severe, without psychotic features: Secondary | ICD-10-CM

## 2018-07-21 ENCOUNTER — Other Ambulatory Visit: Payer: Self-pay | Admitting: Psychiatry

## 2018-08-08 ENCOUNTER — Other Ambulatory Visit: Payer: Self-pay | Admitting: Psychiatry

## 2018-08-10 ENCOUNTER — Other Ambulatory Visit: Payer: Self-pay

## 2018-08-10 ENCOUNTER — Encounter: Payer: Self-pay | Admitting: Psychiatry

## 2018-08-10 ENCOUNTER — Ambulatory Visit (INDEPENDENT_AMBULATORY_CARE_PROVIDER_SITE_OTHER): Payer: No Typology Code available for payment source | Admitting: Psychiatry

## 2018-08-10 DIAGNOSIS — G4726 Circadian rhythm sleep disorder, shift work type: Secondary | ICD-10-CM | POA: Diagnosis not present

## 2018-08-10 DIAGNOSIS — F411 Generalized anxiety disorder: Secondary | ICD-10-CM | POA: Diagnosis not present

## 2018-08-10 DIAGNOSIS — F4001 Agoraphobia with panic disorder: Secondary | ICD-10-CM

## 2018-08-10 DIAGNOSIS — F314 Bipolar disorder, current episode depressed, severe, without psychotic features: Secondary | ICD-10-CM | POA: Diagnosis not present

## 2018-08-10 DIAGNOSIS — F5105 Insomnia due to other mental disorder: Secondary | ICD-10-CM

## 2018-08-10 MED ORDER — SERTRALINE HCL 50 MG PO TABS: 75.0000 mg | ORAL_TABLET | Freq: Every day | ORAL | 1 refills | Status: DC

## 2018-08-10 NOTE — Progress Notes (Signed)
Zoe Ryan [id number] 07/23/1980 37 y.o.    Subjective:   Patient ID:  Zoe Ryan is a 38 y.o. (DOB 05/01/1980) female.  Chief Complaint:  Chief Complaint  Patient presents with  . Follow-up    Medication Management  . Anxiety    Medication Management  . Depression    Medication Management    Anxiety  Symptoms include nervous/anxious behavior. Patient reports no confusion, decreased concentration or suicidal ideas.    Depression         Associated symptoms include fatigue.  Associated symptoms include no decreased concentration and no suicidal ideas.  Past medical history includes anxiety.      Zoe Ryan is phoned today urgently because of recent severe TR bipolar depressive and anxiety symptoms that have been interfering with her ability to work.  Last visit was Jul 13, 2018.  She had had a partial response to sertraline 50+ Vraylar 6. No meds were changed.  Continued improving.  More active.  Not as down.  Still anxiety but better. Depression 3-4/10.  Anxiety 4-5/10.  1 panic/week a lot better.  Normally triggered over going to work.  Functioning well at work. Intermittent FMLA and hasn't missed any days since last visit.  Good work function. Conc better and good.  Enjoyment and exercise again.  Sticking with 3 daily of Xanax XR 1 mg.  Xanax 0.5 about 7 times per week for sleep and anxiety. Sleep good with more disciplined schedule helped. 8 hours.  No NM.  A few mood swings with anger but managed.  Lasting a few hours to a couple of days.  Sleep ok through them.  Relaxation exercises help.  In March her diagnosis was changed to bipolar depression.    She called again on February 27 asking for FMLA.  Failed psychiatric medications include sertraline, Lexapro, paroxetine, fluoxetine, venlafaxine, lithium, lamotrigine doesn't remember why she stopped, valproic acid, Abilify, lithium, Rexulti, Wellbutrin 450, duloxetine 120  .failed to respond to lexapro plus Rexulti  She  says the propranolol is not very helpful for anxiety, but taken it once or twice for tremor with anxiety.  Review of Systems:  Review of Systems  Constitutional: Positive for fatigue.  Genitourinary:       Stress diarrhea   Neurological: Negative for tremors and weakness.  Psychiatric/Behavioral: Positive for depression and dysphoric mood. Negative for agitation, behavioral problems, confusion, decreased concentration, hallucinations, self-injury, sleep disturbance and suicidal ideas. The patient is nervous/anxious. The patient is not hyperactive.     Medications: I have reviewed the patient's current medications.  Current Outpatient Medications  Medication Sig Dispense Refill  . ALPRAZolam (XANAX) 0.5 MG tablet TAKE 1 TABLET BY MOUTH DAILY AT BEDTIME 90 tablet 0  . ALPRAZOLAM XR 1 MG 24 hr tablet TAKE 1 TABLET BY MOUTH 3 TIMES DAILY 270 tablet 0  . Armodafinil (NUVIGIL) 250 MG tablet Take 1 tablet (250 mg total) by mouth daily. 90 tablet 0  . buPROPion (WELLBUTRIN XL) 150 MG 24 hr tablet TAKE 3 TABLETS BY MOUTH EVERY MORNING (Patient taking differently: 150 mg daily. ) 270 tablet 0  . Cariprazine HCl (VRAYLAR) 6 MG CAPS Take 6 mg by mouth daily.    Marland Kitchen. etonogestrel (NEXPLANON) 68 MG IMPL implant 1 each by Subdermal route once.    . Multiple Vitamin (MULITIVITAMIN WITH MINERALS) TABS Take 1 tablet by mouth daily.      . sertraline (ZOLOFT) 50 MG tablet take 1 tablet daily 30 tablet 1  . zaleplon (SONATA) 10  MG capsule TAKE 1 TABLET BY MOUTH DAILY AT BEDTIME AS NEEDED 90 capsule 0   No current facility-administered medications for this visit.     Medication Side Effects: None  Allergies: No Known Allergies  Past Medical History:  Diagnosis Date  . Anxiety   . Chronic shoulder pain   . Depression   . History of tobacco use    Quit 2013    Family History  Problem Relation Age of Onset  . Anxiety disorder Maternal Grandmother   . Mental retardation Maternal Grandmother   .  Congestive Heart Failure Maternal Grandfather   . Cancer Maternal Grandfather        lymphoma  . Stroke Paternal Grandfather   . Mental illness Brother        depression  . Hyperlipidemia Brother     Social History   Socioeconomic History  . Marital status: Married    Spouse name: [name]  . Number of children: 0  . Years of education: College  . Highest education level: Not on file  Occupational History  . Occupation: Building control surveyor: [employer]  Social Needs  . Financial resource strain: Not on file  . Food insecurity:    Worry: Not on file    Inability: Not on file  . Transportation needs:    Medical: Not on file    Non-medical: Not on file  Tobacco Use  . Smoking status: Former Games developer  . Smokeless tobacco: Never Used  Substance and Sexual Activity  . Alcohol use: Yes    Alcohol/week: 11.0 standard drinks    Types: 11 Cans of beer per week  . Drug use: No  . Sexual activity: Never    Partners: Male    Comment: NuvaRing  Lifestyle  . Physical activity:    Days per week: Not on file    Minutes per session: Not on file  . Stress: Not on file  Relationships  . Social connections:    Talks on phone: Not on file    Gets together: Not on file    Attends religious service: Not on file    Active member of club or organization: Not on file    Attends meetings of clubs or organizations: Not on file    Relationship status: Not on file  . Intimate partner violence:    Fear of current or ex partner: Not on file    Emotionally abused: Not on file    Physically abused: Not on file    Forced sexual activity: Not on file  Other Topics Concern  . Not on file  Social History Narrative   Lives with her husband.     Education: College   Exercise: Yes    Past Medical History, Surgical history, Social history, and Family history were reviewed and updated as appropriate.   Please see review of systems for further details on the patient's review from today.    Objective:   Physical Exam:  There were no vitals taken for this visit.  Physical Exam Neurological:     Mental Status: She is alert and oriented to person, place, and time.     Cranial Nerves: No dysarthria.  Psychiatric:        Attention and Perception: Attention normal.        Mood and Affect: Mood is anxious and depressed.        Speech: Speech normal.        Behavior: Behavior is cooperative.  Thought Content: Thought content normal. Thought content is not paranoid or delusional. Thought content does not include homicidal or suicidal ideation. Thought content does not include homicidal or suicidal plan.        Cognition and Memory: Cognition and memory normal.        Judgment: Judgment normal.     Comments: Moderate anxiety.  Depression is improved to mild to moderate     Lab Review:     Component Value Date/Time   NA 137 01/04/2017 1420   K 4.0 01/04/2017 1420   CL 98 01/04/2017 1420   CO2 23 01/04/2017 1420   GLUCOSE 86 01/04/2017 1420   GLUCOSE 76 07/08/2015 1440   BUN 6 01/04/2017 1420   CREATININE 0.71 01/04/2017 1420   CREATININE 0.66 07/08/2015 1440   CALCIUM 9.3 01/04/2017 1420   PROT 7.9 01/04/2017 1420   ALBUMIN 3.9 01/04/2017 1420   AST 22 01/04/2017 1420   ALT 26 01/04/2017 1420   ALKPHOS 99 01/04/2017 1420   BILITOT 0.2 01/04/2017 1420   GFRNONAA 111 01/04/2017 1420   GFRAA 128 01/04/2017 1420       Component Value Date/Time   WBC 7.7 01/04/2017 1420   WBC 6.4 07/08/2015 1440   RBC 4.48 01/04/2017 1420   RBC 4.38 07/08/2015 1440   HGB 14.3 01/04/2017 1420   HCT 40.9 01/04/2017 1420   PLT 363 01/04/2017 1420   MCV 91 01/04/2017 1420   MCH 31.9 01/04/2017 1420   MCH 32.4 07/08/2015 1440   MCHC 35.0 01/04/2017 1420   MCHC 34.4 07/08/2015 1440   RDW 14.3 01/04/2017 1420   LYMPHSABS 3.0 01/04/2017 1420   MONOABS 384 07/08/2015 1440   EOSABS 0.0 01/04/2017 1420   BASOSABS 0.0 01/04/2017 1420    No results found for: POCLITH,  LITHIUM   No results found for: PHENYTOIN, PHENOBARB, VALPROATE, CBMZ   .res Assessment: Plan:    Severe bipolar I disorder, current or most recent episode depressed (HCC)  Generalized anxiety disorder  Panic disorder with agoraphobia  Shift work sleep disorder  Insomnia due to mental condition   Zoe Ryan has seen improvement with the med changes at the last visit increasing Vraylar to 6 mg a day and adding sertraline 50 mg for anxiety including panic disorder.  The depression has improved from severe to mild to moderate at this point.  Anxiety is still at a moderate level and has not improved as much since the last visit.  The sertraline has helped some with the anxiety.  Her work function has improved to normal levels as the depression has improved.  She is tolerating the medications well..  Her depression and anxiety are treatment resistant.   At one time she was using the Xanax excessively but she is not doing so now.  She still requires it.  She was able to get the dosage back down to the prescribed dosage.  Discussed the addiction risk and withdrawal from that. We discussed the short-term risks associated with benzodiazepines including sedation and increased fall risk among others.  Discussed long-term side effect risk including dependence, potential withdrawal symptoms, and the potential eventual dose-related risk of dementia.  However her anxiety is also significant.  Increase dosage of sertraline 75 mg in hopes of blocking the panic and she will let us know if it causes mood swings and hope reduce anxiety.  Discussed other side effects.   She has treatment resistant depression but improved with Vraylar.  Option retry Lamotrigine or try Jordan  or increase the Vraylar or a trial of lithium... She is tolerating the Vraylar.  I continue Vraylar to 6 mg daily because she has seen some improvement in the depression.  Discussed side effects.  She agrees with this plan.  Discussed potential  metabolic side effects associated with atypical antipsychotics, as well as potential risk for movement side effects. Advised pt to contact office if movement side effects occur.   She did not see a positive response from propranolol but it could be considered again at a higher dosage for her severe anxiety.  Shift work managed with Nuvigil  Insomnia has resolved as the depression has improved.  FU 6 weeks  Meredith Staggers, MD, DFAPA   Please see After Visit Summary for patient specific instructions.  No future appointments.  No orders of the defined types were placed in this encounter.     -------------------------------

## 2018-08-15 NOTE — Telephone Encounter (Signed)
Goes to [facility] for treatment

## 2018-08-20 ENCOUNTER — Other Ambulatory Visit: Payer: Self-pay | Admitting: Psychiatry

## 2018-08-21 ENCOUNTER — Other Ambulatory Visit: Payer: Self-pay | Admitting: Psychiatry

## 2018-08-21 NOTE — Telephone Encounter (Signed)
This Dose should be DC. She's now taking Vraylar 6 mg 1 capsule daily.

## 2018-08-21 NOTE — Telephone Encounter (Signed)
Looks like she should be on 6 mg ? Is she using 2 different strengths?

## 2018-08-22 ENCOUNTER — Other Ambulatory Visit: Payer: Self-pay | Admitting: Psychiatry

## 2018-08-23 ENCOUNTER — Other Ambulatory Visit: Payer: Self-pay | Admitting: Psychiatry

## 2018-08-24 ENCOUNTER — Other Ambulatory Visit: Payer: Self-pay

## 2018-08-24 ENCOUNTER — Telehealth: Payer: Self-pay | Admitting: Psychiatry

## 2018-08-24 NOTE — Telephone Encounter (Signed)
Patient need refill on Vraylar 1.5 and 4.5 to be sent to pharmacy on file

## 2018-08-24 NOTE — Telephone Encounter (Signed)
Both 1.5 and 4.5 mg dosages should be denied.  I believe I sent in a prescription for Vraylar 6 mg 1 daily.  That dosage is correct

## 2018-08-25 ENCOUNTER — Other Ambulatory Visit: Payer: Self-pay

## 2018-08-25 MED ORDER — VRAYLAR 6 MG PO CAPS: 6.0000 mg | ORAL_CAPSULE | Freq: Every day | ORAL | 1 refills | Status: DC

## 2018-08-25 NOTE — Telephone Encounter (Signed)
Will resend for 6 mg to pharmacy

## 2018-09-04 ENCOUNTER — Ambulatory Visit (INDEPENDENT_AMBULATORY_CARE_PROVIDER_SITE_OTHER): Payer: No Typology Code available for payment source | Admitting: Psychiatry

## 2018-09-04 ENCOUNTER — Encounter: Payer: Self-pay | Admitting: Psychiatry

## 2018-09-04 ENCOUNTER — Other Ambulatory Visit: Payer: Self-pay

## 2018-09-04 DIAGNOSIS — F5105 Insomnia due to other mental disorder: Secondary | ICD-10-CM | POA: Diagnosis not present

## 2018-09-04 DIAGNOSIS — F314 Bipolar disorder, current episode depressed, severe, without psychotic features: Secondary | ICD-10-CM | POA: Diagnosis not present

## 2018-09-04 DIAGNOSIS — F411 Generalized anxiety disorder: Secondary | ICD-10-CM | POA: Diagnosis not present

## 2018-09-04 DIAGNOSIS — F4001 Agoraphobia with panic disorder: Secondary | ICD-10-CM

## 2018-09-04 DIAGNOSIS — G4726 Circadian rhythm sleep disorder, shift work type: Secondary | ICD-10-CM

## 2018-09-04 NOTE — Progress Notes (Signed)
Zoe Ryan [id number] 07/07/1980 37 y.o.    Subjective:   Patient ID:  Zoe Ryan is a 38 y.o. (DOB 03/28/1980) female.  Chief Complaint:  Chief Complaint  Patient presents with  . Anxiety  . Depression    med management    Anxiety Patient reports no confusion, decreased concentration or suicidal ideas.    Depression        Associated symptoms include no decreased concentration, no fatigue and no suicidal ideas.  Past medical history includes anxiety.      Zoe Ryan is seen again today urgently because of recent severe TR bipolar depressive and anxiety symptoms that have been interfering with her ability to work.  Her last visit was August 10, 2018 when 4 moderately severe anxiety her sertraline was increased from 50 to 75 mg daily.  She was also continued on the recently increased Vraylar 6 mg daily.  This appointment was moved earlier at her request bc recent instability.  Continued improving.  Not as much anxiety going to work in the am and better thereafter.  Reduced panic and none in 2 weeks. More active.  Not as down.  Still improving with depression and better sleep and exercise helping. Functioning well at work. Intermittent FMLA and hasn't missed any days since last visit.  Good work function. Conc better and good.  Enjoyment and exercise again.  Can struggle to maintain job interest but may be passing.  Been there 10 years.  No mood swings.  Can enjoy things.  Sticking with 3 daily of Xanax XR 1 mg.  Xanax 0.5 about 7 times per week for sleep and anxiety. Sleep good with more disciplined schedule helped. 8 hours.  No NM.  In March her diagnosis was changed to bipolar depression.    Failed psychiatric medications include sertraline, Lexapro, paroxetine, fluoxetine, venlafaxine, lithium, lamotrigine doesn't remember why she stopped, valproic acid, Abilify, lithium, Rexulti, Wellbutrin 450, duloxetine 120  .failed to respond to lexapro plus Rexulti  She says the propranolol  is not very helpful for anxiety, but taken it once or twice for tremor with anxiety.  Review of Systems:  Review of Systems  Constitutional: Negative for fatigue.  Genitourinary:       Stress diarrhea   Neurological: Negative for tremors and weakness.  Psychiatric/Behavioral: Positive for depression. Negative for agitation, behavioral problems, confusion, decreased concentration, hallucinations, self-injury, sleep disturbance and suicidal ideas. The patient is not hyperactive.     Medications: I have reviewed the patient's current medications.  Current Outpatient Medications  Medication Sig Dispense Refill  . ALPRAZolam (XANAX) 0.5 MG tablet TAKE 1 TABLET BY MOUTH DAILY AT BEDTIME 90 tablet 0  . ALPRAZOLAM XR 1 MG 24 hr tablet TAKE 1 TABLET BY MOUTH 3 TIMES DAILY 270 tablet 0  . Armodafinil (NUVIGIL) 250 MG tablet Take 1 tablet (250 mg total) by mouth daily. 90 tablet 0  . buPROPion (WELLBUTRIN XL) 150 MG 24 hr tablet TAKE 3 TABLETS BY MOUTH EVERY MORNING (Patient taking differently: 150 mg daily. ) 270 tablet 0  . Cariprazine HCl (VRAYLAR) 6 MG CAPS Take 1 capsule (6 mg total) by mouth daily. 30 capsule 1  . etonogestrel (NEXPLANON) 68 MG IMPL implant 1 each by Subdermal route once.    . Multiple Vitamin (MULITIVITAMIN WITH MINERALS) TABS Take 1 tablet by mouth daily.      . sertraline (ZOLOFT) 50 MG tablet Take 1.5 tablets (75 mg total) by mouth daily. 45 tablet 1  . zaleplon (SONATA) 10  MG capsule TAKE 1 TABLET BY MOUTH DAILY AT BEDTIME AS NEEDED 90 capsule 0   No current facility-administered medications for this visit.     Medication Side Effects: None  Allergies: No Known Allergies  Past Medical History:  Diagnosis Date  . Anxiety   . Chronic shoulder pain   . Depression   . History of tobacco use    Quit 2013    Family History  Problem Relation Age of Onset  . Anxiety disorder Maternal Grandmother   . Mental retardation Maternal Grandmother   . Congestive Heart  Failure Maternal Grandfather   . Cancer Maternal Grandfather        lymphoma  . Stroke Paternal Grandfather   . Mental illness Brother        depression  . Hyperlipidemia Brother     Social History   Socioeconomic History  . Marital status: Married    Spouse name: Ronaldo MiyamotoKyle  . Number of children: 0  . Years of education: College  . Highest education level: Not on file  Occupational History  . Occupation: Building control surveyorlab tech    Employer: [employer]  Social Needs  . Financial resource strain: Not on file  . Food insecurity    Worry: Not on file    Inability: Not on file  . Transportation needs    Medical: Not on file    Non-medical: Not on file  Tobacco Use  . Smoking status: Former Games developermoker  . Smokeless tobacco: Never Used  Substance and Sexual Activity  . Alcohol use: Yes    Alcohol/week: 11.0 standard drinks    Types: 11 Cans of beer per week  . Drug use: No  . Sexual activity: Never    Partners: Male    Comment: NuvaRing  Lifestyle  . Physical activity    Days per week: Not on file    Minutes per session: Not on file  . Stress: Not on file  Relationships  . Social Musicianconnections    Talks on phone: Not on file    Gets together: Not on file    Attends religious service: Not on file    Active member of club or organization: Not on file    Attends meetings of clubs or organizations: Not on file    Relationship status: Not on file  . Intimate partner violence    Fear of current or ex partner: Not on file    Emotionally abused: Not on file    Physically abused: Not on file    Forced sexual activity: Not on file  Other Topics Concern  . Not on file  Social History Narrative   Lives with her husband.     Education: College   Exercise: Yes    Past Medical History, Surgical history, Social history, and Family history were reviewed and updated as appropriate.   Please see review of systems for further details on the patient's review from today.   Objective:   Physical  Exam:  There were no vitals taken for this visit.  Physical Exam Constitutional:      General: She is not in acute distress.    Appearance: She is well-developed.  Musculoskeletal:        General: No deformity.  Neurological:     Mental Status: She is alert and oriented to person, place, and time.     Coordination: Coordination normal.  Psychiatric:        Mood and Affect: Mood is anxious and depressed. Affect is not  labile, blunt, angry or inappropriate.        Speech: Speech normal.        Behavior: Behavior normal.        Thought Content: Thought content normal. Thought content does not include homicidal or suicidal ideation. Thought content does not include homicidal or suicidal plan.        Judgment: Judgment normal.     Comments: Insight intact. No auditory or visual hallucinations. No delusions.  anxiety and depression are now mild     Lab Review:     Component Value Date/Time   NA 137 01/04/2017 1420   K 4.0 01/04/2017 1420   CL 98 01/04/2017 1420   CO2 23 01/04/2017 1420   GLUCOSE 86 01/04/2017 1420   GLUCOSE 76 07/08/2015 1440   BUN 6 01/04/2017 1420   CREATININE 0.71 01/04/2017 1420   CREATININE 0.66 07/08/2015 1440   CALCIUM 9.3 01/04/2017 1420   PROT 7.9 01/04/2017 1420   ALBUMIN 3.9 01/04/2017 1420   AST 22 01/04/2017 1420   ALT 26 01/04/2017 1420   ALKPHOS 99 01/04/2017 1420   BILITOT 0.2 01/04/2017 1420   GFRNONAA 111 01/04/2017 1420   GFRAA 128 01/04/2017 1420       Component Value Date/Time   WBC 7.7 01/04/2017 1420   WBC 6.4 07/08/2015 1440   RBC 4.48 01/04/2017 1420   RBC 4.38 07/08/2015 1440   HGB 14.3 01/04/2017 1420   HCT 40.9 01/04/2017 1420   PLT 363 01/04/2017 1420   MCV 91 01/04/2017 1420   MCH 31.9 01/04/2017 1420   MCH 32.4 07/08/2015 1440   MCHC 35.0 01/04/2017 1420   MCHC 34.4 07/08/2015 1440   RDW 14.3 01/04/2017 1420   LYMPHSABS 3.0 01/04/2017 1420   MONOABS 384 07/08/2015 1440   EOSABS 0.0 01/04/2017 1420   BASOSABS  0.0 01/04/2017 1420    No results found for: POCLITH, LITHIUM   No results found for: PHENYTOIN, PHENOBARB, VALPROATE, CBMZ   .res Assessment: Plan:    Aleksia was seen today for anxiety and depression.  Diagnoses and all orders for this visit:  Severe bipolar I disorder, current or most recent episode depressed ([place])  Generalized anxiety disorder  Panic disorder with agoraphobia  Insomnia due to mental condition  Shift work sleep disorder   Khamil had her diagnosis changed from major depression to bipolar depression in March.   Her depression and anxiety had been treatment resistant.  Her depression, panic and generalized anxiety disorder are all chronic. She has seen improvement with the med changes at the last visit increasing Vraylar to 6 mg a day and adding and increasing sertraline to 75 mg for anxiety including panic disorder.  The depression and anxiety levels have reduced over the course of this last month to a tolerable level and manageable level.  Her work function has improved to normal levels as the depression has improved.  She was missing work but she is no longer.  She is tolerating the medications well..   At one time she was using the Xanax excessively but she is not doing so now.  She still requires it.  She was able to get the dosage back down to the prescribed dosage.  Discussed the addiction risk and withdrawal from that. We discussed the short-term risks associated with benzodiazepines including sedation and increased fall risk among others.  Discussed long-term side effect risk including dependence, potential withdrawal symptoms, and the potential eventual dose-related risk of dementia. Disc risks of Xanax  and sonata but she's tolerating it and will continue bc recent severe sx.  Disc amnesia.  In the future we may attempt to gradually reduce this further.   Continue sertraline 75 mg in hopes of blocking the panic and she will let us know if it causes mood swings and  hope reduce anxiety.  Discussed other side effects.   She has treatment resistant depression but improved with Vraylar.  Discussed potential metabolic side effects associated with atypical antipsychotics, as well as potential risk for movement side effects. Advised pt to contact office if movement side effects occur.   No med changes.  She did not see a positive response from propranolol but it could be considered again at a higher dosage for her severe anxiety.  Shift work managed with Nuvigil  Insomnia has resolved as the depression has improved.  FU 8 weeks  Meredith Staggersarey Cottle, MD, DFAPA   Please see After Visit Summary for patient specific instructions.  Future Appointments  Date Time Provider Department Center  11/02/2018 11:00 AM Cottle, Steva Readyarey G Jr., MD [facility] None    No orders of the defined types were placed in this encounter.     -------------------------------

## 2018-10-10 ENCOUNTER — Other Ambulatory Visit: Payer: Self-pay | Admitting: Psychiatry

## 2018-10-17 ENCOUNTER — Other Ambulatory Visit: Payer: Self-pay | Admitting: Psychiatry

## 2018-10-18 NOTE — Telephone Encounter (Signed)
Please submit her 3 controlled meds for 90 days, has appt 08/27

## 2018-10-20 ENCOUNTER — Encounter

## 2018-10-20 ENCOUNTER — Ambulatory Visit: Payer: No Typology Code available for payment source | Admitting: Psychiatry

## 2018-10-25 ENCOUNTER — Other Ambulatory Visit: Payer: Self-pay | Admitting: Psychiatry

## 2018-11-02 ENCOUNTER — Other Ambulatory Visit: Payer: Self-pay

## 2018-11-02 ENCOUNTER — Encounter: Payer: Self-pay | Admitting: Psychiatry

## 2018-11-02 ENCOUNTER — Ambulatory Visit (INDEPENDENT_AMBULATORY_CARE_PROVIDER_SITE_OTHER): Payer: No Typology Code available for payment source | Admitting: Psychiatry

## 2018-11-02 DIAGNOSIS — F314 Bipolar disorder, current episode depressed, severe, without psychotic features: Secondary | ICD-10-CM

## 2018-11-02 DIAGNOSIS — F411 Generalized anxiety disorder: Secondary | ICD-10-CM | POA: Diagnosis not present

## 2018-11-02 DIAGNOSIS — G4726 Circadian rhythm sleep disorder, shift work type: Secondary | ICD-10-CM

## 2018-11-02 DIAGNOSIS — F4001 Agoraphobia with panic disorder: Secondary | ICD-10-CM

## 2018-11-02 DIAGNOSIS — F5105 Insomnia due to other mental disorder: Secondary | ICD-10-CM

## 2018-11-02 NOTE — Patient Instructions (Signed)
Increase sertraline to 1-1/2 tablets daily for anxiety and depression

## 2018-11-02 NOTE — Progress Notes (Signed)
Zoe Ryan [id number] 08/02/1980 37 y.o.    Subjective:   Patient ID:  Zoe Pavlovnna Justen is a 38 y.o. (DOB 05/24/1980) female.  Chief Complaint:  Chief Complaint  Patient presents with  . Follow-up    Medication Management  . Anxiety    Medication Management  . Depression    Medication Management    Anxiety Patient reports no confusion, decreased concentration or suicidal ideas.    Depression        Associated symptoms include no decreased concentration, no fatigue and no suicidal ideas.  Past medical history includes anxiety.      Zoe Pavlovnna Tibbetts is seen again today urgently because of recent severe TR bipolar depressive and anxiety symptoms that have been interfering with her ability to work.  AT visit August 10, 2018 when for moderately severe anxiety she was instructed to increase sertraline  from 50 to 75 mg daily.  She's only been taking 50 sertraline bc forgot to increase it.  She was also continued on the recently increased Vraylar 6 mg daily.    Last visit September 04, 2018.  No meds were changed.  Depression about the same or worse.  She has become less active.Marland Kitchen.  Anxiety still high with work..  No recent panic.  Low motivation and interest.  Want to sleep and watch TV.  Stopped exercise bc motivation.  Poor productivity at home. Continued improving.  Not as much anxiety going to work in the am and better thereafter.  Missed 2 days work DT depression.  Functioning well at work. Intermittent FMLA .  Can struggle to maintain job interest but may be passing.  Been there 10 years.  No mood swings.  Can enjoy things.  Sticking with 3 daily of Xanax XR 1 mg.  Xanax 0.5 about 7 times per week for sleep. Sleep good with more disciplined schedule helped. 8 hours.  If can will sleep a lot.  No NM.  In March her diagnosis was changed to bipolar depression.    Failed psychiatric medications include sertraline, Lexapro, paroxetine, fluoxetine, venlafaxine, lithium, lamotrigine doesn't remember why  she stopped, valproic acid, Abilify, lithium, Rexulti, Wellbutrin 450, duloxetine 120  .failed to respond to lexapro plus Rexulti  She says the propranolol is not very helpful for anxiety, but taken it once or twice for tremor with anxiety. Had some mood improvement in stability with increase Vraylar to 6. Ambien amnesia  Review of Systems:  Review of Systems  Constitutional: Negative for fatigue.  Genitourinary:       Stress diarrhea   Neurological: Negative for tremors and weakness.  Psychiatric/Behavioral: Positive for depression. Negative for agitation, behavioral problems, confusion, decreased concentration, hallucinations, self-injury, sleep disturbance and suicidal ideas. The patient is not hyperactive.     Medications: I have reviewed the patient's current medications.  Current Outpatient Medications  Medication Sig Dispense Refill  . ALPRAZolam (XANAX) 0.5 MG tablet TAKE 1 TABLET BY MOUTH EVERY NIGHT AT BEDTIME 90 tablet 0  . ALPRAZOLAM XR 1 MG 24 hr tablet TAKE 1 TABLET BY MOUTH 3 TIMES DAILY 270 tablet 0  . Armodafinil (NUVIGIL) 250 MG tablet Take 1 tablet (250 mg total) by mouth daily. 90 tablet 0  . buPROPion (WELLBUTRIN XL) 150 MG 24 hr tablet TAKE 3 TABLETS BY MOUTH EVERY MORNING 270 tablet 0  . etonogestrel (NEXPLANON) 68 MG IMPL implant 1 each by Subdermal route once.    . Multiple Vitamin (MULITIVITAMIN WITH MINERALS) TABS Take 1 tablet by mouth daily.      .Marland Kitchen  sertraline (ZOLOFT) 50 MG tablet Take 1.5 tablets (75 mg total) by mouth daily. 45 tablet 2  . VRAYLAR 6 MG CAPS TAKE 1 CAPSULE BY MOUTH DAILY. 30 capsule 1  . zaleplon (SONATA) 10 MG capsule TAKE 1 TABLET BY MOUTH AT BEDTIME AS NEEDED 90 capsule 0   No current facility-administered medications for this visit.     Medication Side Effects: None  Allergies: No Known Allergies  Past Medical History:  Diagnosis Date  . Anxiety   . Chronic shoulder pain   . Depression   . History of tobacco use    Quit 2013     Family History  Problem Relation Age of Onset  . Anxiety disorder Maternal Grandmother   . Mental retardation Maternal Grandmother   . Congestive Heart Failure Maternal Grandfather   . Cancer Maternal Grandfather        lymphoma  . Stroke Paternal Grandfather   . Mental illness Brother        depression  . Hyperlipidemia Brother     Social History   Socioeconomic History  . Marital status: Married    Spouse name: Marylyn Ishihara  . Number of children: 0  . Years of education: College  . Highest education level: Not on file  Occupational History  . Occupation: Firefighter: [employer]  . Financial resource strain: Not on file  . Food insecurity    Worry: Not on file    Inability: Not on file  . Transportation needs    Medical: Not on file    Non-medical: Not on file  Tobacco Use  . Smoking status: Former Research scientist (life sciences)  . Smokeless tobacco: Never Used  Substance and Sexual Activity  . Alcohol use: Yes    Alcohol/week: 11.0 standard drinks    Types: 11 Cans of beer per week  . Drug use: No  . Sexual activity: Never    Partners: Male    Comment: NuvaRing  Lifestyle  . Physical activity    Days per week: Not on file    Minutes per session: Not on file  . Stress: Not on file  Relationships  . Social Herbalist on phone: Not on file    Gets together: Not on file    Attends religious service: Not on file    Active member of club or organization: Not on file    Attends meetings of clubs or organizations: Not on file    Relationship status: Not on file  . Intimate partner violence    Fear of current or ex partner: Not on file    Emotionally abused: Not on file    Physically abused: Not on file    Forced sexual activity: Not on file  Other Topics Concern  . Not on file  Social History Narrative   Lives with her husband.     Education: College   Exercise: Yes    Past Medical History, Surgical history, Social history, and Family  history were reviewed and updated as appropriate.   Please see review of systems for further details on the patient's review from today.   Objective:   Physical Exam:  There were no vitals taken for this visit.  Physical Exam Constitutional:      General: She is not in acute distress.    Appearance: She is well-developed.  Musculoskeletal:        General: No deformity.  Neurological:     Mental Status: She  is alert and oriented to person, place, and time.     Coordination: Coordination normal.  Psychiatric:        Mood and Affect: Mood is anxious and depressed. Affect is not labile, blunt, angry or inappropriate.        Speech: Speech normal.        Behavior: Behavior normal.        Thought Content: Thought content normal. Thought content does not include homicidal or suicidal ideation. Thought content does not include homicidal or suicidal plan.        Judgment: Judgment normal.     Comments: Insight intact. No auditory or visual hallucinations. No delusions.  anxiety and depression are now mild     Lab Review:     Component Value Date/Time   NA 137 01/04/2017 1420   K 4.0 01/04/2017 1420   CL 98 01/04/2017 1420   CO2 23 01/04/2017 1420   GLUCOSE 86 01/04/2017 1420   GLUCOSE 76 07/08/2015 1440   BUN 6 01/04/2017 1420   CREATININE 0.71 01/04/2017 1420   CREATININE 0.66 07/08/2015 1440   CALCIUM 9.3 01/04/2017 1420   PROT 7.9 01/04/2017 1420   ALBUMIN 3.9 01/04/2017 1420   AST 22 01/04/2017 1420   ALT 26 01/04/2017 1420   ALKPHOS 99 01/04/2017 1420   BILITOT 0.2 01/04/2017 1420   GFRNONAA 111 01/04/2017 1420   GFRAA 128 01/04/2017 1420       Component Value Date/Time   WBC 7.7 01/04/2017 1420   WBC 6.4 07/08/2015 1440   RBC 4.48 01/04/2017 1420   RBC 4.38 07/08/2015 1440   HGB 14.3 01/04/2017 1420   HCT 40.9 01/04/2017 1420   PLT 363 01/04/2017 1420   MCV 91 01/04/2017 1420   MCH 31.9 01/04/2017 1420   MCH 32.4 07/08/2015 1440   MCHC 35.0 01/04/2017  1420   MCHC 34.4 07/08/2015 1440   RDW 14.3 01/04/2017 1420   LYMPHSABS 3.0 01/04/2017 1420   MONOABS 384 07/08/2015 1440   EOSABS 0.0 01/04/2017 1420   BASOSABS 0.0 01/04/2017 1420    No results found for: POCLITH, LITHIUM   No results found for: PHENYTOIN, PHENOBARB, VALPROATE, CBMZ   .res Assessment: Plan:    Kashala was seen today for follow-up, anxiety and depression.  Diagnoses and all orders for this visit:  Severe bipolar I disorder, current or most recent episode depressed ([facility])  Generalized anxiety disorder  Panic disorder with agoraphobia  Insomnia due to mental condition  Shift work sleep disorder   Daney had her diagnosis changed from major depression to bipolar depression in March.   Her depression and anxiety had been treatment resistant.  Her depression, panic and generalized anxiety disorder are all chronic. She has seen improvement with the med changes at the last visit increasing Vraylar to 6 mg a day and adding and increasing sertraline to 50 mg for anxiety including panic disorder.  She is actually gotten a little bit worse since her last visit.  She has missed 2 days of work.  She has become less productive at home.  She still having a lot of anxiety about work.  And she still depressed.  At one time she was using the Xanax excessively but she is not doing so now.  She still requires it.  She was able to get the dosage back down to the prescribed dosage.  Discussed the addiction risk and withdrawal from that. We discussed the short-term risks associated with benzodiazepines including sedation and increased  fall risk among others.  Discussed long-term side effect risk including dependence, potential withdrawal symptoms, and the potential eventual dose-related risk of dementia. Disc risks of Xanax and sonata but she's tolerating it and will continue bc recent severe sx.  Disc amnesia.  In the future we may attempt to gradually reduce this further.  Increase  sertraline 75 mg in hopes of blocking the panic and she will let us know if it causes mood swings and hope reduce anxiety and depression.  She never did this when asked before..  Discussed other side effects.  Other option to increase Wellbutrin again would not help anxiety and may make it worse.   She has treatment resistant depression but improved with Vraylar.  Discussed potential metabolic side effects associated with atypical antipsychotics, as well as potential risk for movement side effects. Advised pt to contact office if movement side effects occur.   She did not see a positive response from propranolol but it could be considered again at a higher dosage for her severe anxiety.  Shift work managed with Nuvigil  Try Xanax HS only without the Sonata due to memory risks.  She's not had amnesia with this but did with Ambien..  FU 8 weeks  Meredith Staggersarey Cottle, MD, DFAPA   Please see After Visit Summary for patient specific instructions.  No future appointments.  No orders of the defined types were placed in this encounter.     -------------------------------

## 2018-11-16 ENCOUNTER — Other Ambulatory Visit: Payer: Self-pay | Admitting: Psychiatry

## 2018-12-25 ENCOUNTER — Ambulatory Visit (INDEPENDENT_AMBULATORY_CARE_PROVIDER_SITE_OTHER): Payer: PRIVATE HEALTH INSURANCE | Admitting: Psychiatry

## 2018-12-25 ENCOUNTER — Other Ambulatory Visit: Payer: Self-pay

## 2018-12-25 ENCOUNTER — Encounter: Payer: Self-pay | Admitting: Psychiatry

## 2018-12-25 DIAGNOSIS — F411 Generalized anxiety disorder: Secondary | ICD-10-CM

## 2018-12-25 DIAGNOSIS — F5105 Insomnia due to other mental disorder: Secondary | ICD-10-CM | POA: Diagnosis not present

## 2018-12-25 DIAGNOSIS — F4001 Agoraphobia with panic disorder: Secondary | ICD-10-CM | POA: Diagnosis not present

## 2018-12-25 DIAGNOSIS — G4726 Circadian rhythm sleep disorder, shift work type: Secondary | ICD-10-CM

## 2018-12-25 DIAGNOSIS — F314 Bipolar disorder, current episode depressed, severe, without psychotic features: Secondary | ICD-10-CM | POA: Diagnosis not present

## 2018-12-25 MED ORDER — SERTRALINE HCL 100 MG PO TABS: 100.0000 mg | ORAL_TABLET | Freq: Every day | ORAL | 0 refills | Status: DC

## 2018-12-25 NOTE — Progress Notes (Signed)
Zoe Ryan [id number] 08/08/1980 38 y.o.   Virtual Visit via Telephone Note  I connected with pt by telephone and verified that I am speaking with the correct person using two identifiers.   I discussed the limitations, risks, security and privacy concerns of performing an evaluation and management service by telephone and the availability of in person appointments. I also discussed with the patient that there may be a patient responsible charge related to this service. The patient expressed understanding and agreed to proceed.  I discussed the assessment and treatment plan with the patient. The patient was provided an opportunity to ask questions and all were answered. The patient agreed with the plan and demonstrated an understanding of the instructions.   The patient was advised to call back or seek an in-person evaluation if the symptoms worsen or if the condition fails to improve as anticipated.  I provided 15 minutes of non-face-to-face time during this encounter. The call started at 11 and ended at 1115. The patient was located at home and the provider was located office.   Subjective:   Patient ID:  Zoe Ryan is a 38 y.o. (DOB 06/11/1980) female.  Chief Complaint:  Chief Complaint  Patient presents with  . Follow-up    Medication Management  . Depression    Medication Management  . Anxiety    Medication Management    Anxiety Symptoms include nervous/anxious behavior. Patient reports no confusion, decreased concentration or suicidal ideas.    Depression        Associated symptoms include no decreased concentration, no fatigue and no suicidal ideas.  Past medical history includes anxiety.      Zoe Ryan is seen again today urgently because of recent severe TR bipolar depressive and anxiety symptoms that have been interfering with her ability to work.  AT visit August 10, 2018 when for moderately severe anxiety she was instructed to increase sertraline  from 50 to 75 mg  daily.  She's only been taking 50 sertraline bc forgot to increase it.  She was also continued on the recently increased Vraylar 6 mg daily.    Last visit August 2020.  Patient was encouraged to increase sertraline from 50 to 75 mg again for panic and anxiety.  She had not done so previously as suggested.  She was not improved as of the last visit.  Increased sertraline from 50 to 75 for anxiety and panic.  Continued Wellbutrin 450, Vraylar 6, Xanax XR 1 mg TID, Nuvigil 250, Sonata HS.  Improvement in mood and some reduction of anxiety with Zoloft.  Less work anxiety and a bit more satisfied overall.  No SE.  No mood swings.  Anxiety reduced to with work..  No recent panic.  Low motivation and interest.  Still Want to sleep and watch TV.  Stopped exercise bc motivation but trying to get better.  Poor productivity at home. No Missed days work DT depression.  Functioning well at work. Intermittent FMLA .  Can struggle to maintain job interest but may be passing.  Been there 10 years.  No mood swings.  Can enjoy things. Normallyh 8-9 hours sleep.  On weekend can sleep 10 hours then nap.  Sticking with 3 daily of Xanax XR 1 mg.  Xanax 0.5 about 7 times per week for sleep. Sleep good with more disciplined schedule helped. 8 hours.  If can will sleep a lot.  No NM.  In March her diagnosis was changed to bipolar depression.    Failed psychiatric  medications include sertraline, Lexapro, paroxetine, fluoxetine, venlafaxine, Wellbutrin 450, duloxetine 120  .failed to respond to lexapro plus Rexulti lithium, lamotrigine doesn't remember why she stopped, valproic acid, Abilify, lithium, Rexulti,  She says the propranolol is not very helpful for anxiety, but taken it once or twice for tremor with anxiety. Had some mood improvement in stability with increase Vraylar to 6. Ambien amnesia  Review of Systems:  Review of Systems  Constitutional: Negative for fatigue.  Gastrointestinal: Negative for diarrhea.   Genitourinary:       Stress diarrhea resolved    Neurological: Negative for tremors and weakness.  Psychiatric/Behavioral: Positive for depression and dysphoric mood. Negative for agitation, behavioral problems, confusion, decreased concentration, hallucinations, self-injury, sleep disturbance and suicidal ideas. The patient is nervous/anxious. The patient is not hyperactive.     Medications: I have reviewed the patient's current medications.  Current Outpatient Medications  Medication Sig Dispense Refill  . ALPRAZolam (XANAX) 0.5 MG tablet TAKE 1 TABLET BY MOUTH EVERY NIGHT AT BEDTIME 90 tablet 0  . ALPRAZOLAM XR 1 MG 24 hr tablet TAKE 1 TABLET BY MOUTH 3 TIMES DAILY 270 tablet 0  . Armodafinil 250 MG tablet TAKE 1 TABLET BY MOUTH EVERY DAY 90 tablet 0  . buPROPion (WELLBUTRIN XL) 150 MG 24 hr tablet TAKE 3 TABLETS BY MOUTH EVERY MORNING (Patient taking differently: Take 150 mg by mouth daily. ) 270 tablet 0  . etonogestrel (NEXPLANON) 68 MG IMPL implant 1 each by Subdermal route once.    . Multiple Vitamin (MULITIVITAMIN WITH MINERALS) TABS Take 1 tablet by mouth daily.      . sertraline (ZOLOFT) 100 MG tablet Take 1 tablet (100 mg total) by mouth daily. 90 tablet 0  . VRAYLAR 6 MG CAPS TAKE 1 CAPSULE BY MOUTH DAILY. 30 capsule 1  . zaleplon (SONATA) 10 MG capsule TAKE 1 TABLET BY MOUTH AT BEDTIME AS NEEDED 90 capsule 0   No current facility-administered medications for this visit.     Medication Side Effects: None  Allergies: No Known Allergies  Past Medical History:  Diagnosis Date  . Anxiety   . Chronic shoulder pain   . Depression   . History of tobacco use    Quit 2013    Family History  Problem Relation Age of Onset  . Anxiety disorder Maternal Grandmother   . Mental retardation Maternal Grandmother   . Congestive Heart Failure Maternal Grandfather   . Cancer Maternal Grandfather        lymphoma  . Stroke Paternal Grandfather   . Mental illness Brother         depression  . Hyperlipidemia Brother     Social History   Socioeconomic History  . Marital status: Married    Spouse name: Marylyn Ishihara  . Number of children: 0  . Years of education: College  . Highest education level: Not on file  Occupational History  . Occupation: Firefighter: [employer]  . Financial resource strain: Not on file  . Food insecurity    Worry: Not on file    Inability: Not on file  . Transportation needs    Medical: Not on file    Non-medical: Not on file  Tobacco Use  . Smoking status: Former Research scientist (life sciences)  . Smokeless tobacco: Never Used  Substance and Sexual Activity  . Alcohol use: Yes    Alcohol/week: 11.0 standard drinks    Types: 11 Cans of beer per week  . Drug  use: No  . Sexual activity: Never    Partners: Male    Comment: NuvaRing  Lifestyle  . Physical activity    Days per week: Not on file    Minutes per session: Not on file  . Stress: Not on file  Relationships  . Social Musician on phone: Not on file    Gets together: Not on file    Attends religious service: Not on file    Active member of club or organization: Not on file    Attends meetings of clubs or organizations: Not on file    Relationship status: Not on file  . Intimate partner violence    Fear of current or ex partner: Not on file    Emotionally abused: Not on file    Physically abused: Not on file    Forced sexual activity: Not on file  Other Topics Concern  . Not on file  Social History Narrative   Lives with her husband.     Education: College   Exercise: Yes    Past Medical History, Surgical history, Social history, and Family history were reviewed and updated as appropriate.   Please see review of systems for further details on the patient's review from today.   Objective:   Physical Exam:  There were no vitals taken for this visit.  Physical Exam Neurological:     Mental Status: She is alert and oriented to person, place,  and time.     Cranial Nerves: No dysarthria.  Psychiatric:        Attention and Perception: Attention normal.        Mood and Affect: Mood is anxious and depressed.        Speech: Speech normal.        Behavior: Behavior is cooperative.        Thought Content: Thought content normal. Thought content is not paranoid or delusional. Thought content does not include homicidal or suicidal ideation. Thought content does not include homicidal or suicidal plan.        Cognition and Memory: Cognition and memory normal.        Judgment: Judgment normal.     Comments: HYpoverbal. Insight ok     Lab Review:     Component Value Date/Time   NA 137 01/04/2017 1420   K 4.0 01/04/2017 1420   CL 98 01/04/2017 1420   CO2 23 01/04/2017 1420   GLUCOSE 86 01/04/2017 1420   GLUCOSE 76 07/08/2015 1440   BUN 6 01/04/2017 1420   CREATININE 0.71 01/04/2017 1420   CREATININE 0.66 07/08/2015 1440   CALCIUM 9.3 01/04/2017 1420   PROT 7.9 01/04/2017 1420   ALBUMIN 3.9 01/04/2017 1420   AST 22 01/04/2017 1420   ALT 26 01/04/2017 1420   ALKPHOS 99 01/04/2017 1420   BILITOT 0.2 01/04/2017 1420   GFRNONAA 111 01/04/2017 1420   GFRAA 128 01/04/2017 1420       Component Value Date/Time   WBC 7.7 01/04/2017 1420   WBC 6.4 07/08/2015 1440   RBC 4.48 01/04/2017 1420   RBC 4.38 07/08/2015 1440   HGB 14.3 01/04/2017 1420   HCT 40.9 01/04/2017 1420   PLT 363 01/04/2017 1420   MCV 91 01/04/2017 1420   MCH 31.9 01/04/2017 1420   MCH 32.4 07/08/2015 1440   MCHC 35.0 01/04/2017 1420   MCHC 34.4 07/08/2015 1440   RDW 14.3 01/04/2017 1420   LYMPHSABS 3.0 01/04/2017 1420   MONOABS 384 07/08/2015  1440   EOSABS 0.0 01/04/2017 1420   BASOSABS 0.0 01/04/2017 1420    No results found for: POCLITH, LITHIUM   No results found for: PHENYTOIN, PHENOBARB, VALPROATE, CBMZ   .res Assessment: Plan:    Shaquanda was seen today for follow-up, depression and anxiety.  Diagnoses and all orders for this visit:  Severe  bipolar I disorder, current or most recent episode depressed (HCC) -     sertraline (ZOLOFT) 100 MG tablet; Take 1 tablet (100 mg total) by mouth daily.  Generalized anxiety disorder -     sertraline (ZOLOFT) 100 MG tablet; Take 1 tablet (100 mg total) by mouth daily.  Panic disorder with agoraphobia -     sertraline (ZOLOFT) 100 MG tablet; Take 1 tablet (100 mg total) by mouth daily.  Insomnia due to mental condition  Shift work sleep disorder   Kelia had her diagnosis changed from major depression to bipolar depression in March.   Her depression and anxiety had been treatment resistant.  Her depression, panic and generalized anxiety disorder are all chronic. She has seen gradual improvement with the med changes at the last visit increasing Vraylar to 6 mg a day and adding and increasing sertraline.  Her symptoms of depression and anxiety have improved from severe to moderate she has become less productive at home.  She still having a lot of anxiety about work.  And she still depressed.  At one time she was using the Xanax excessively but she is not doing so now.  She still requires it.  She was able to get the dosage back down to the prescribed dosage.  Discussed the addiction risk and withdrawal from that. We discussed the short-term risks associated with benzodiazepines including sedation and increased fall risk among others.  Discussed long-term side effect risk including dependence, potential withdrawal symptoms, and the potential eventual dose-related risk of dementia. Disc risks of Xanax and sonata but she's tolerating it and will continue bc recent severe sx.  Disc amnesia.  In the future we may attempt to gradually reduce this further.  Increase sertraline further to 100 mg in hopes of further reducing generalized anxiety disorder and depression.  The sertraline has blocked of the panic attacks.  And she will let us know if it causes mood swings and hope reduce anxiety and depression. .   Discussed other side effects.   She has treatment resistant depression but improved with Vraylar.  Discussed potential metabolic side effects associated with atypical antipsychotics, as well as potential risk for movement side effects. Advised pt to contact office if movement side effects occur.   She did not see a positive response from propranolol but it could be considered again at a higher dosage for her severe anxiety.  Shift work managed with Nuvigil  Try Xanax HS only without the Sonata due to memory risks.  She's not had amnesia with this but did with Ambien..  FU 8 weeks  Meredith Staggers, MD, DFAPA   Please see After Visit Summary for patient specific instructions.  No future appointments.  No orders of the defined types were placed in this encounter.     -------------------------------

## 2018-12-29 ENCOUNTER — Other Ambulatory Visit: Payer: Self-pay | Admitting: Psychiatry

## 2019-01-17 ENCOUNTER — Other Ambulatory Visit: Payer: Self-pay | Admitting: Psychiatry

## 2019-01-17 NOTE — Telephone Encounter (Signed)
Get 90 day for all, apt 02/2019

## 2019-02-23 ENCOUNTER — Encounter: Payer: Self-pay | Admitting: Psychiatry

## 2019-02-23 ENCOUNTER — Ambulatory Visit (INDEPENDENT_AMBULATORY_CARE_PROVIDER_SITE_OTHER): Payer: PRIVATE HEALTH INSURANCE | Admitting: Psychiatry

## 2019-02-23 DIAGNOSIS — F314 Bipolar disorder, current episode depressed, severe, without psychotic features: Secondary | ICD-10-CM | POA: Diagnosis not present

## 2019-02-23 DIAGNOSIS — F5105 Insomnia due to other mental disorder: Secondary | ICD-10-CM

## 2019-02-23 DIAGNOSIS — F411 Generalized anxiety disorder: Secondary | ICD-10-CM

## 2019-02-23 DIAGNOSIS — F4001 Agoraphobia with panic disorder: Secondary | ICD-10-CM

## 2019-02-23 DIAGNOSIS — G4726 Circadian rhythm sleep disorder, shift work type: Secondary | ICD-10-CM

## 2019-02-23 MED ORDER — PAROXETINE HCL 20 MG PO TABS: 10.0000 mg | ORAL_TABLET | Freq: Every day | ORAL | 1 refills | Status: DC

## 2019-02-23 NOTE — Progress Notes (Signed)
Zoe Ryan [id number] 08/15/1980 38 y.o.   Virtual Visit via Telephone Note  I connected with pt by telephone and verified that I am speaking with the correct person using two identifiers.   I discussed the limitations, risks, security and privacy concerns of performing an evaluation and management service by telephone and the availability of in person appointments. I also discussed with the patient that there may be a patient responsible charge related to this service. The patient expressed understanding and agreed to proceed.  I discussed the assessment and treatment plan with the patient. The patient was provided an opportunity to ask questions and all were answered. The patient agreed with the plan and demonstrated an understanding of the instructions.   The patient was advised to call back or seek an in-person evaluation if the symptoms worsen or if the condition fails to improve as anticipated.  I provided 23 minutes of non-face-to-face time during this encounter. The call started at 1137 and ended at 1200. The patient was located at home and the provider was located office.   Subjective:   Patient ID:  Zoe Ryan is a 38 y.o. (DOB 1980/07/09) female.  Chief Complaint:  Chief Complaint  Patient presents with  . Follow-up    Medication Management  . Other    Bipolar 1  . Anxiety    Anxiety Symptoms include nervous/anxious behavior. Patient reports no confusion, decreased concentration or suicidal ideas.    Depression        Associated symptoms include no decreased concentration, no fatigue and no suicidal ideas.  Past medical history includes anxiety.      Zoe Ryan is seen again today urgently because of recent severe TR bipolar depressive and anxiety symptoms that have been interfering with her ability to work.  AT visit August 10, 2018 when for moderately severe anxiety she was instructed to increase sertraline  from 50 to 75 mg daily.  She's only been taking 50  sertraline bc forgot to increase it.  She was also continued on the recently increased Vraylar 6 mg daily.    At visit August 2020.  Patient was encouraged to increase sertraline from 50 to 75 mg again for panic and anxiety.  She had not done so previously as suggested.  She was not improved as of the last visit. Increased sertraline from 50 to 75 for anxiety and panic.  Continued Wellbutrin 450, Vraylar 6, Xanax XR 1 mg TID, Nuvigil 250, Sonata HS.  Last visit October 19 and the following changes were made: Increase sertraline further to 100 mg in hopes of further reducing generalized anxiety disorder and depression.  The sertraline has blocked of the panic attacks  It made a little difference but not that much.  Anxiety is worse than depression.  A lot of physical anxiety.  More irritability.  Low activity level.  Still no panic.  Improvement in mood and some reduction of anxiety with Zoloft.  Less work anxiety and a bit more satisfied overall.  No SE.  No mood swings.  Anxiety reduced to with work..  No recent panic.  Low motivation and interest.  Still Want to sleep and watch TV.  Stopped exercise bc motivation but trying to get better.  Poor productivity at home. No Missed days work DT depression.  Functioning well at work. Intermittent FMLA .  Can struggle to maintain job interest but may be passing.  Been there 10 years.  No mood swings.  Can enjoy things. Normallyh 8-9 hours sleep.  On weekend can sleep 10 hours then nap.  Sticking with 3 daily of Xanax XR 1 mg.  Xanax 0.5 about 7 times per week for sleep. Sleep good with more disciplined schedule helped. 8 hours.  If can will sleep a lot.  No NM.  In March 2020 her diagnosis was changed to bipolar depression.    Failed psychiatric medications include sertraline 100, Lexapro, paroxetine, fluoxetine, venlafaxine, Wellbutrin 450, duloxetine 120   Failed to respond to lexapro plus Rexulti lithium,  lamotrigine doesn't remember why she  stopped, valproic acid, Abilify, lithium, Rexulti,  She says the propranolol is not very helpful for anxiety, but taken it once or twice for tremor with anxiety. Had some mood improvement in stability with increase Vraylar to 6. Ambien amnesia  Review of Systems:  Review of Systems  Constitutional: Negative for fatigue.  Gastrointestinal: Negative for diarrhea.  Genitourinary:       Stress diarrhea resolved    Neurological: Negative for tremors and weakness.  Psychiatric/Behavioral: Positive for depression and dysphoric mood. Negative for agitation, behavioral problems, confusion, decreased concentration, hallucinations, self-injury, sleep disturbance and suicidal ideas. The patient is nervous/anxious. The patient is not hyperactive.     Medications: I have reviewed the patient's current medications.  Current Outpatient Medications  Medication Sig Dispense Refill  . ALPRAZolam (XANAX) 0.5 MG tablet TAKE 1 TABLET BY MOUTH EVERY NIGHT AT BEDTIME 90 tablet 0  . ALPRAZOLAM XR 1 MG 24 hr tablet TAKE 1 TABLET BY MOUTH 3 TIMES DAILY 270 tablet 0  . Armodafinil 250 MG tablet TAKE 1 TABLET BY MOUTH EVERY DAY 90 tablet 0  . buPROPion (WELLBUTRIN XL) 150 MG 24 hr tablet TAKE 3 TABLETS BY MOUTH EVERY MORNING (Patient taking differently: Take 150 mg by mouth daily. ) 270 tablet 0  . etonogestrel (NEXPLANON) 68 MG IMPL implant 1 each by Subdermal route once.    . Multiple Vitamin (MULITIVITAMIN WITH MINERALS) TABS Take 1 tablet by mouth daily.      . sertraline (ZOLOFT) 100 MG tablet Take 1 tablet (100 mg total) by mouth daily. 90 tablet 0  . VRAYLAR 6 MG CAPS TAKE 1 CAPSULE BY MOUTH DAILY. 30 capsule 1  . zaleplon (SONATA) 10 MG capsule TAKE 1 CAPSULE BY MOUTH EVERY NIGHT AT BEDTIME 90 capsule 0   No current facility-administered medications for this visit.    Medication Side Effects: None  Allergies: No Known Allergies  Past Medical History:  Diagnosis Date  . Anxiety   . Chronic  shoulder pain   . Depression   . History of tobacco use    Quit 2013    Family History  Problem Relation Age of Onset  . Anxiety disorder Maternal Grandmother   . Mental retardation Maternal Grandmother   . Congestive Heart Failure Maternal Grandfather   . Cancer Maternal Grandfather        lymphoma  . Stroke Paternal Grandfather   . Mental illness Brother        depression  . Hyperlipidemia Brother     Social History   Socioeconomic History  . Marital status: Married    Spouse name: [name]  . Number of children: 0  . Years of education: College  . Highest education level: Not on file  Occupational History  . Occupation: Building control surveyor: [employer]  Tobacco Use  . Smoking status: Former Games developer  . Smokeless tobacco: Never Used  Substance and Sexual Activity  . Alcohol use: Yes  Alcohol/week: 11.0 standard drinks    Types: 11 Cans of beer per week  . Drug use: No  . Sexual activity: Never    Partners: Male    Comment: NuvaRing  Other Topics Concern  . Not on file  Social History Narrative   Lives with her husband.     Education: [employer]   Exercise: Yes   Social Determinants of Health   Financial Resource Strain:   . Difficulty of Paying Living Expenses: Not on file  Food Insecurity:   . Worried About Programme researcher, broadcasting/film/video in the Last Year: Not on file  . Ran Out of Food in the Last Year: Not on file  Transportation Needs:   . Lack of Transportation (Medical): Not on file  . Lack of Transportation (Non-Medical): Not on file  Physical Activity:   . Days of Exercise per Week: Not on file  . Minutes of Exercise per Session: Not on file  Stress:   . Feeling of Stress : Not on file  Social Connections:   . Frequency of Communication with Friends and Family: Not on file  . Frequency of Social Gatherings with Friends and Family: Not on file  . Attends Religious Services: Not on file  . Active Member of Clubs or Organizations: Not on file  . Attends  Banker Meetings: Not on file  . Marital Status: Not on file  Intimate Partner Violence:   . Fear of Current or Ex-Partner: Not on file  . Emotionally Abused: Not on file  . Physically Abused: Not on file  . Sexually Abused: Not on file    Past Medical History, Surgical history, Social history, and Family history were reviewed and updated as appropriate.   Please see review of systems for further details on the patient's review from today.   Objective:   Physical Exam:  There were no vitals taken for this visit.  Physical Exam Neurological:     Mental Status: She is alert and oriented to person, place, and time.     Cranial Nerves: No dysarthria.  Psychiatric:        Attention and Perception: Attention normal.        Mood and Affect: Mood is anxious and depressed.        Speech: Speech normal.        Behavior: Behavior is cooperative.        Thought Content: Thought content normal. Thought content is not paranoid or delusional. Thought content does not include homicidal or suicidal ideation. Thought content does not include homicidal or suicidal plan.        Cognition and Memory: Cognition and memory normal.        Judgment: Judgment normal.     Comments: HYpoverbal. Insight ok     Lab Review:     Component Value Date/Time   NA 137 01/04/2017 1420   K 4.0 01/04/2017 1420   CL 98 01/04/2017 1420   CO2 23 01/04/2017 1420   GLUCOSE 86 01/04/2017 1420   GLUCOSE 76 07/08/2015 1440   BUN 6 01/04/2017 1420   CREATININE 0.71 01/04/2017 1420   CREATININE 0.66 07/08/2015 1440   CALCIUM 9.3 01/04/2017 1420   PROT 7.9 01/04/2017 1420   ALBUMIN 3.9 01/04/2017 1420   AST 22 01/04/2017 1420   ALT 26 01/04/2017 1420   ALKPHOS 99 01/04/2017 1420   BILITOT 0.2 01/04/2017 1420   GFRNONAA 111 01/04/2017 1420   GFRAA 128 01/04/2017 1420  Component Value Date/Time   WBC 7.7 01/04/2017 1420   WBC 6.4 07/08/2015 1440   RBC 4.48 01/04/2017 1420   RBC 4.38  07/08/2015 1440   HGB 14.3 01/04/2017 1420   HCT 40.9 01/04/2017 1420   PLT 363 01/04/2017 1420   MCV 91 01/04/2017 1420   MCH 31.9 01/04/2017 1420   MCH 32.4 07/08/2015 1440   MCHC 35.0 01/04/2017 1420   MCHC 34.4 07/08/2015 1440   RDW 14.3 01/04/2017 1420   LYMPHSABS 3.0 01/04/2017 1420   MONOABS 384 07/08/2015 1440   EOSABS 0.0 01/04/2017 1420   BASOSABS 0.0 01/04/2017 1420    No results found for: POCLITH, LITHIUM   No results found for: PHENYTOIN, PHENOBARB, VALPROATE, CBMZ   .res Assessment: Plan:    [name] was seen today for follow-up, other and anxiety.  Diagnoses and all orders for this visit:  Generalized anxiety disorder  Severe bipolar I disorder, current or most recent episode depressed (HCC)  Panic disorder with agoraphobia  Insomnia due to mental condition  Shift work sleep disorder   [name] had her diagnosis changed from major depression to bipolar depression in March.   Her depression and anxiety had been treatment resistant.  Her depression, panic and generalized anxiety disorder are all chronic. She has seen gradual improvement with the med changes at the last visit increasing Vraylar to 6 mg a day and adding and increasing sertraline.  Her symptoms of depression and anxiety have improved from severe to moderate she has become less productive at home.  She still having a lot of anxiety about work.  And she still depressed.  At one time she was using the Xanax excessively but she is not doing so now.  She still requires it.  She was able to get the dosage back down to the prescribed dosage.  Discussed the addiction risk and withdrawal from that. We discussed the short-term risks associated with benzodiazepines including sedation and increased fall risk among others.  Discussed long-term side effect risk including dependence, potential withdrawal symptoms, and the potential eventual dose-related risk of dementia. Disc risks of Xanax and sonata but she's  tolerating it and will continue bc recent severe sx.  Disc amnesia.  In the future we may attempt to gradually reduce this further.  Last Increase sertraline further to 100 mg in hopes of further reducing generalized anxiety disorder and depression.  The sertraline has blocked of the panic attacks.  This increase in sertraline has apparently caused some irritability with only mild improvements and anxiety and depression.  Anxiety remains a chief complaint.  Discussed the option of switching to paroxetine versus Option gabapentin.  It is difficult to treat panic and anxiety and bipolar patient sometimes because the most effective panic meds are SSRIs which can cause mood swings and bipolar patients.  We may be able to get by with a low dose of paroxetine without triggering mood swings since she has not had major mood swings with the sertraline.  We will switch to paroxetine 20 mg over 1 week. The alternative of gabapentin would take a longer period of adjustment and is unlikely to have any potential to help with depressive symptoms.  There are some potential concerns of paroxetine being a strong 2 D6 inhibitor and affecting blood levels of other medications such as Vraylar we discussed this potential drug interaction and she will let us know if she starts having more side effects with other medications.  She has treatment resistant depression but improved with  Vraylar.  Discussed potential metabolic side effects associated with atypical antipsychotics, as well as potential risk for movement side effects. Advised pt to contact office if movement side effects occur.   She did not see a positive response from propranolol but it could be considered again at a higher dosage for her severe anxiety.  Shift work managed with Nuvigil  Try Xanax HS only without the Sonata due to memory risks.  She's not had amnesia with this but did with Ambien..  FU 6 weeks  Meredith Staggers, MD, DFAPA   Please see After  Visit Summary for patient specific instructions.  No future appointments.  No orders of the defined types were placed in this encounter.     -------------------------------

## 2019-02-26 ENCOUNTER — Other Ambulatory Visit: Payer: Self-pay | Admitting: Psychiatry

## 2019-02-28 NOTE — Telephone Encounter (Signed)
01/29 apt 90 day

## 2019-04-02 ENCOUNTER — Telehealth: Payer: Self-pay | Admitting: Psychiatry

## 2019-04-02 ENCOUNTER — Other Ambulatory Visit: Payer: Self-pay

## 2019-04-02 MED ORDER — PAROXETINE HCL 20 MG PO TABS: 20.0000 mg | ORAL_TABLET | Freq: Every day | ORAL | 0 refills | Status: DC

## 2019-04-02 NOTE — Telephone Encounter (Signed)
Noted, Rx submitted

## 2019-04-02 NOTE — Telephone Encounter (Signed)
Last Rx submitted was Paroxetine 20 mg take 1/2 tablet daily

## 2019-04-02 NOTE — Telephone Encounter (Signed)
Pt requesting a refill on her Paxil. She stated the pharmacy stated it's too early,but she only has 0.5 pill left. Fill at the [facility]. Appt scheduled for 1/29.

## 2019-04-02 NOTE — Telephone Encounter (Signed)
Paroxetine 20 mg 1 daily #30. No refills.  Please send. Thanks

## 2019-04-06 ENCOUNTER — Ambulatory Visit (INDEPENDENT_AMBULATORY_CARE_PROVIDER_SITE_OTHER): Payer: No Typology Code available for payment source | Admitting: Psychiatry

## 2019-04-06 ENCOUNTER — Encounter: Payer: Self-pay | Admitting: Psychiatry

## 2019-04-06 DIAGNOSIS — F4001 Agoraphobia with panic disorder: Secondary | ICD-10-CM | POA: Diagnosis not present

## 2019-04-06 DIAGNOSIS — F5105 Insomnia due to other mental disorder: Secondary | ICD-10-CM | POA: Diagnosis not present

## 2019-04-06 DIAGNOSIS — F314 Bipolar disorder, current episode depressed, severe, without psychotic features: Secondary | ICD-10-CM | POA: Diagnosis not present

## 2019-04-06 DIAGNOSIS — F411 Generalized anxiety disorder: Secondary | ICD-10-CM

## 2019-04-06 DIAGNOSIS — G4726 Circadian rhythm sleep disorder, shift work type: Secondary | ICD-10-CM

## 2019-04-06 MED ORDER — ALPRAZOLAM 0.5 MG PO TABS: 0.5000 mg | ORAL_TABLET | Freq: Every day | ORAL | 0 refills | Status: DC

## 2019-04-06 MED ORDER — ALPRAZOLAM ER 1 MG PO TB24: 1.0000 mg | ORAL_TABLET | Freq: Three times a day (TID) | ORAL | 0 refills | Status: DC

## 2019-04-06 MED ORDER — ARMODAFINIL 250 MG PO TABS: 250.0000 mg | ORAL_TABLET | Freq: Every day | ORAL | 0 refills | Status: DC

## 2019-04-06 MED ORDER — PAROXETINE HCL 20 MG PO TABS: 30.0000 mg | ORAL_TABLET | Freq: Every day | ORAL | 0 refills | Status: DC

## 2019-04-06 MED ORDER — VRAYLAR 6 MG PO CAPS: 1.0000 | ORAL_CAPSULE | Freq: Every day | ORAL | 2 refills | Status: DC

## 2019-04-06 NOTE — Progress Notes (Signed)
Zoe Ryan [id number] 28-Aug-1980 39 y.o.   Virtual Visit via Telephone Note  I connected with pt by telephone and verified that I am speaking with the correct person using two identifiers.   I discussed the limitations, risks, security and privacy concerns of performing an evaluation and management service by telephone and the availability of in person appointments. I also discussed with the patient that there may be a patient responsible charge related to this service. The patient expressed understanding and agreed to proceed.  I discussed the assessment and treatment plan with the patient. The patient was provided an opportunity to ask questions and all were answered. The patient agreed with the plan and demonstrated an understanding of the instructions.   The patient was advised to call back or seek an in-person evaluation if the symptoms worsen or if the condition fails to improve as anticipated.  I provided 30 minutes of non-face-to-face time during this encounter. The call started at 1145 and ended at 1215. The patient was located at home and the provider was located office.   Subjective:   Patient ID:  Zoe Ryan is a 39 y.o. (DOB [birth year]) female.  Chief Complaint:  Chief Complaint  Patient presents with  . Anxiety    med changes  . Depression  . Sleeping Problem    Anxiety Symptoms include nervous/anxious behavior. Patient reports no confusion, decreased concentration, dizziness or suicidal ideas.    Depression        Associated symptoms include no decreased concentration, no fatigue and no suicidal ideas.  Past medical history includes anxiety.      Zoe Ryan is seen again today urgently because of recent severe TR bipolar depressive and anxiety symptoms that have been interfering with her ability to work.  AT visit August 10, 2018 when for moderately severe anxiety she was instructed to increase sertraline  from 50 to 75 mg daily.  She's only been taking 50  sertraline bc forgot to increase it.  She was also continued on the recently increased Vraylar 6 mg daily.    At visit August 2020.  Patient was encouraged to increase sertraline from 50 to 75 mg again for panic and anxiety.  She had not done so previously as suggested.  She was not improved as of the last visit. Increased sertraline from 50 to 75 for anxiety and panic.  Continued Wellbutrin 450, Vraylar 6, Xanax XR 1 mg TID, Nuvigil 250, Sonata HS.   visit October 19 and the following changes were made: Increase sertraline further to 100 mg in hopes of further reducing generalized anxiety disorder and depression.  The sertraline has blocked of the panic attacks.  It made a little difference but not that much.  Anxiety is worse than depression.   Last visit February 23, 2019.  She was taking sertraline 100 mg daily.  She felt the sertraline was causing some irritability.  We switched to paroxetine 20 mg.   Noticed a decrease in depression and anxiety and not as irritable with switch to paroxetine.  Tolerating well at HS. Daytime paroxetine dizzy.  Improvement plateau.  Now dep 4/10.  Anxiety 5/10.   Less work anxiety and a bit more satisfied overall.  Less tearful.   No mood swings.  Anxiety reduced to with work..  No recent panic.  A little more energy.  Still fairly Low motivation and interest.  Still Want to sleep and watch TV.  Stopped exercise bc motivation but trying to get better.  Poor productivity  at home without change. 1 Missed days work DT depression.  Functioning well at work. Intermittent FMLA .  Can struggle to maintain job interest but may be passing.  Been there 10 years.  No mood swings.  Can enjoy things. Good sleep.  Normally 8-9 hours sleep.  On weekend can sleep 10 hours then nap.  Sticking with 3 daily of Xanax XR 1 mg.  Xanax 0.5 about 7 times per week for sleep. Sleep good with more disciplined schedule helped. 8 hours.  If can will sleep a lot.  No NM.  In March 2020 her  diagnosis was changed to bipolar depression.    Failed psychiatric medications include sertraline 100, Lexapro, paroxetine, fluoxetine, venlafaxine, Wellbutrin 450, duloxetine 120  , sertraline 100 more irritable Failed to respond to lexapro plus Rexulti lithium,  lamotrigine doesn't remember why she stopped, valproic acid, Abilify, lithium, Rexulti,  She says the propranolol is not very helpful for anxiety, but taken it once or twice for tremor with anxiety. Had some mood improvement in stability with increase Vraylar to 6. Ambien amnesia  Review of Systems:  Review of Systems  Constitutional: Negative for fatigue.  Gastrointestinal: Negative for diarrhea.  Genitourinary:       Stress diarrhea resolved    Neurological: Negative for dizziness, tremors and weakness.  Psychiatric/Behavioral: Positive for depression and dysphoric mood. Negative for agitation, behavioral problems, confusion, decreased concentration, hallucinations, self-injury, sleep disturbance and suicidal ideas. The patient is nervous/anxious. The patient is not hyperactive.     Medications: I have reviewed the patient's current medications.  Current Outpatient Medications  Medication Sig Dispense Refill  . ALPRAZolam (ALPRAZOLAM XR) 1 MG 24 hr tablet Take 1 tablet (1 mg total) by mouth 3 (three) times daily. 270 tablet 0  . ALPRAZolam (XANAX) 0.5 MG tablet Take 1 tablet (0.5 mg total) by mouth at bedtime. 90 tablet 0  . Armodafinil 250 MG tablet Take 1 tablet (250 mg total) by mouth daily. 90 tablet 0  . buPROPion (WELLBUTRIN XL) 150 MG 24 hr tablet TAKE 3 TABLETS BY MOUTH EVERY MORNING (Patient taking differently: Take 150 mg by mouth daily. ) 270 tablet 0  . Cariprazine HCl (VRAYLAR) 6 MG CAPS Take 1 capsule (6 mg total) by mouth daily. 30 capsule 2  . etonogestrel (NEXPLANON) 68 MG IMPL implant 1 each by Subdermal route once.    . Multiple Vitamin (MULITIVITAMIN WITH MINERALS) TABS Take 1 tablet by mouth daily.       Marland Kitchen PARoxetine (PAXIL) 20 MG tablet Take 1.5 tablets (30 mg total) by mouth daily. 135 tablet 0  . zaleplon (SONATA) 10 MG capsule TAKE 1 CAPSULE BY MOUTH EVERY NIGHT AT BEDTIME 90 capsule 0   No current facility-administered medications for this visit.    Medication Side Effects: None  Allergies: No Known Allergies  Past Medical History:  Diagnosis Date  . Anxiety   . Chronic shoulder pain   . Depression   . History of tobacco use    Quit 2013    Family History  Problem Relation Age of Onset  . Anxiety disorder Maternal Grandmother   . Mental retardation Maternal Grandmother   . Congestive Heart Failure Maternal Grandfather   . Cancer Maternal Grandfather        lymphoma  . Stroke Paternal Grandfather   . Mental illness Brother        depression  . Hyperlipidemia Brother     Social History   Socioeconomic History  . Marital status:  Married    Spouse name: Zoe Ryan  . Number of children: 0  . Years of education: College  . Highest education level: Not on file  Occupational History  . Occupation: Building control surveyor: [employer]  Tobacco Use  . Smoking status: Former Games developer  . Smokeless tobacco: Never Used  Substance and Sexual Activity  . Alcohol use: Yes    Alcohol/week: 11.0 standard drinks    Types: 11 Cans of beer per week  . Drug use: No  . Sexual activity: Never    Partners: Male    Comment: NuvaRing  Other Topics Concern  . Not on file  Social History Narrative   Lives with her husband.     Education: [employer]   Exercise: Yes   Social Determinants of Health   Financial Resource Strain:   . Difficulty of Paying Living Expenses: Not on file  Food Insecurity:   . Worried About Programme researcher, broadcasting/film/video in the Last Year: Not on file  . Ran Out of Food in the Last Year: Not on file  Transportation Needs:   . Lack of Transportation (Medical): Not on file  . Lack of Transportation (Non-Medical): Not on file  Physical Activity:   . Days of Exercise  per Week: Not on file  . Minutes of Exercise per Session: Not on file  Stress:   . Feeling of Stress : Not on file  Social Connections:   . Frequency of Communication with Friends and Family: Not on file  . Frequency of Social Gatherings with Friends and Family: Not on file  . Attends Religious Services: Not on file  . Active Member of Clubs or Organizations: Not on file  . Attends Banker Meetings: Not on file  . Marital Status: Not on file  Intimate Partner Violence:   . Fear of Current or Ex-Partner: Not on file  . Emotionally Abused: Not on file  . Physically Abused: Not on file  . Sexually Abused: Not on file    Past Medical History, Surgical history, Social history, and Family history were reviewed and updated as appropriate.   Please see review of systems for further details on the patient's review from today.   Objective:   Physical Exam:  There were no vitals taken for this visit.  Physical Exam Neurological:     Mental Status: She is alert and oriented to person, place, and time.     Cranial Nerves: No dysarthria.  Psychiatric:        Attention and Perception: Attention normal.        Mood and Affect: Mood is anxious and depressed. Affect is not tearful.        Speech: Speech normal.        Behavior: Behavior is cooperative.        Thought Content: Thought content normal. Thought content is not paranoid or delusional. Thought content does not include homicidal or suicidal ideation. Thought content does not include homicidal or suicidal plan.        Cognition and Memory: Cognition and memory normal.        Judgment: Judgment normal.     Comments: HYpoverbal. Insight ok     Lab Review:     Component Value Date/Time   NA 137 01/04/2017 1420   K 4.0 01/04/2017 1420   CL 98 01/04/2017 1420   CO2 23 01/04/2017 1420   GLUCOSE 86 01/04/2017 1420   GLUCOSE 76 07/08/2015 1440   BUN  6 01/04/2017 1420   CREATININE 0.71 01/04/2017 1420   CREATININE  0.66 07/08/2015 1440   CALCIUM 9.3 01/04/2017 1420   PROT 7.9 01/04/2017 1420   ALBUMIN 3.9 01/04/2017 1420   AST 22 01/04/2017 1420   ALT 26 01/04/2017 1420   ALKPHOS 99 01/04/2017 1420   BILITOT 0.2 01/04/2017 1420   GFRNONAA 111 01/04/2017 1420   GFRAA 128 01/04/2017 1420       Component Value Date/Time   WBC 7.7 01/04/2017 1420   WBC 6.4 07/08/2015 1440   RBC 4.48 01/04/2017 1420   RBC 4.38 07/08/2015 1440   HGB 14.3 01/04/2017 1420   HCT 40.9 01/04/2017 1420   PLT 363 01/04/2017 1420   MCV 91 01/04/2017 1420   MCH 31.9 01/04/2017 1420   MCH 32.4 07/08/2015 1440   MCHC 35.0 01/04/2017 1420   MCHC 34.4 07/08/2015 1440   RDW 14.3 01/04/2017 1420   LYMPHSABS 3.0 01/04/2017 1420   MONOABS 384 07/08/2015 1440   EOSABS 0.0 01/04/2017 1420   BASOSABS 0.0 01/04/2017 1420    No results found for: POCLITH, LITHIUM   No results found for: PHENYTOIN, PHENOBARB, VALPROATE, CBMZ   .res Assessment: Plan:    Zoe Ryan was seen today for anxiety, depression and sleeping problem.  Diagnoses and all orders for this visit:  Severe bipolar I disorder, current or most recent episode depressed (HCC) -     Cariprazine HCl (VRAYLAR) 6 MG CAPS; Take 1 capsule (6 mg total) by mouth daily.  Generalized anxiety disorder -     ALPRAZolam (ALPRAZOLAM XR) 1 MG 24 hr tablet; Take 1 tablet (1 mg total) by mouth 3 (three) times daily. -     PARoxetine (PAXIL) 20 MG tablet; Take 1.5 tablets (30 mg total) by mouth daily.  Panic disorder with agoraphobia -     ALPRAZolam (ALPRAZOLAM XR) 1 MG 24 hr tablet; Take 1 tablet (1 mg total) by mouth 3 (three) times daily. -     PARoxetine (PAXIL) 20 MG tablet; Take 1.5 tablets (30 mg total) by mouth daily.  Insomnia due to mental condition -     ALPRAZolam (XANAX) 0.5 MG tablet; Take 1 tablet (0.5 mg total) by mouth at bedtime.  Shift work sleep disorder -     Armodafinil 250 MG tablet; Take 1 tablet (250 mg total) by mouth daily.   Zoe Ryan had her  diagnosis changed from major depression to bipolar depression in March.   Her depression and anxiety had been treatment resistant.  Her depression, panic and generalized anxiety disorder are all chronic. She has seen gradual improvement with the med changes at the last visit increasing Vraylar to 6 mg a day and adding and increasing sertraline.  Her symptoms of depression and anxiety have improved from severe to moderate she has become less productive at home.  She still having a lot of anxiety about work.  And she still depressed.  At her last appointment in December because of increased irritability with sertraline the decision was made to switch to paroxetine.  She has since noted further reduction in depression and anxiety.  Anxiety is still at a moderate level.  The improvement is appear to plateau.  She is tolerating the paroxetine well. Increase paroxetine to 30 mg daily to further reduce anxiety and potentially depression.  Disc risk cylcing and SE.   Option gabapentin.  It is difficult to treat panic and anxiety and bipolar patient sometimes because the most effective panic meds are SSRIs which  can cause mood swings and bipolar patients.  We may be able to get by with a low dose of paroxetine without triggering mood swings since she has not had major mood swings with the sertraline.   At one time she was using the Xanax excessively but she is not doing so now.  She still requires it.  She was able to get the dosage back down to the prescribed dosage.  Discussed the addiction risk and withdrawal from that. We discussed the short-term risks associated with benzodiazepines including sedation and increased fall risk among others.  Discussed long-term side effect risk including dependence, potential withdrawal symptoms, and the potential eventual dose-related risk of dementia. Disc risks of Xanax and sonata but she's tolerating it and will continue bc recent severe sx.  Disc amnesia.  In the future we  may attempt to gradually reduce this further.  The alternative of gabapentin would take a longer period of adjustment and is unlikely to have any potential to help with depressive symptoms.  There are some potential concerns of paroxetine being a strong 2 D6 inhibitor and affecting blood levels of other medications such as Vraylar we discussed this potential drug interaction and she will let us know if she starts having more side effects with other medications.  She has treatment resistant depression but improved with Vraylar.  Discussed potential metabolic side effects associated with atypical antipsychotics, as well as potential risk for movement side effects. Advised pt to contact office if movement side effects occur.   She did not see a positive response from propranolol but it could be considered again at a higher dosage for her severe anxiety.  Shift work managed with Nuvigil  Try Xanax HS only without the Sonata due to memory risks.  She's not had amnesia with this but did with Ambien..  FU 6 weeks  Zoe Staggersarey Cottle, MD, DFAPA   Please see After Visit Summary for patient specific instructions.  No future appointments.  No orders of the defined types were placed in this encounter.     -------------------------------

## 2019-04-20 ENCOUNTER — Other Ambulatory Visit: Payer: Self-pay | Admitting: Psychiatry

## 2019-04-22 NOTE — Telephone Encounter (Signed)
Last apt 01/29 due back 6 weeks

## 2019-05-02 ENCOUNTER — Other Ambulatory Visit: Payer: Self-pay | Admitting: Psychiatry

## 2019-05-02 DIAGNOSIS — F314 Bipolar disorder, current episode depressed, severe, without psychotic features: Secondary | ICD-10-CM

## 2019-05-17 ENCOUNTER — Telehealth: Payer: Self-pay | Admitting: Psychiatry

## 2019-05-17 NOTE — Telephone Encounter (Signed)
Pt. Made aware.

## 2019-05-17 NOTE — Telephone Encounter (Signed)
PT reports she is feeling numb all the time. Has had major weight gain and is depressed. Issues arising at work because of all this.

## 2019-05-17 NOTE — Telephone Encounter (Signed)
She has had a rough time with depression and anxiety we have made a lot of med changes.  I do not think I can address this outside of an appointment.   Put her on the cancellation list.

## 2019-05-31 ENCOUNTER — Encounter: Payer: Self-pay | Admitting: Psychiatry

## 2019-05-31 ENCOUNTER — Ambulatory Visit (INDEPENDENT_AMBULATORY_CARE_PROVIDER_SITE_OTHER): Payer: No Typology Code available for payment source | Admitting: Psychiatry

## 2019-05-31 DIAGNOSIS — F411 Generalized anxiety disorder: Secondary | ICD-10-CM | POA: Diagnosis not present

## 2019-05-31 DIAGNOSIS — F4001 Agoraphobia with panic disorder: Secondary | ICD-10-CM

## 2019-05-31 DIAGNOSIS — G4726 Circadian rhythm sleep disorder, shift work type: Secondary | ICD-10-CM

## 2019-05-31 DIAGNOSIS — F5105 Insomnia due to other mental disorder: Secondary | ICD-10-CM | POA: Diagnosis not present

## 2019-05-31 DIAGNOSIS — F314 Bipolar disorder, current episode depressed, severe, without psychotic features: Secondary | ICD-10-CM

## 2019-05-31 MED ORDER — QUETIAPINE FUMARATE ER 150 MG PO TB24: 150.0000 mg | ORAL_TABLET | Freq: Every day | ORAL | 1 refills | Status: DC

## 2019-05-31 NOTE — Progress Notes (Signed)
Zoe Ryan [id number] [birth year] 39 y.o.   Virtual Visit via Telephone Note  I connected with pt by telephone and verified that I am speaking with the correct person using two identifiers.   I discussed the limitations, risks, security and privacy concerns of performing an evaluation and management service by telephone and the availability of in person appointments. I also discussed with the patient that there may be a patient responsible charge related to this service. The patient expressed understanding and agreed to proceed.  I discussed the assessment and treatment plan with the patient. The patient was provided an opportunity to ask questions and all were answered. The patient agreed with the plan and demonstrated an understanding of the instructions.   The patient was advised to call back or seek an in-person evaluation if the symptoms worsen or if the condition fails to improve as anticipated.  I provided 30 minutes of non-face-to-face time during this encounter. The call started at 1145 and ended at 1215. The patient was located at home and the provider was located office.   Subjective:   Patient ID:  Zoe Ryan is a 39 y.o. (DOB [birth year]) female.  Chief Complaint:  Chief Complaint  Patient presents with  . Follow-up    Medication Management  . Anxiety    Medication Management  . Depression    Anxiety Symptoms include nervous/anxious behavior. Patient reports no confusion, decreased concentration, dizziness or suicidal ideas.    Depression        Associated symptoms include no decreased concentration, no fatigue and no suicidal ideas.  Past medical history includes anxiety.      [name] is seen again today urgently because of recent severe TR bipolar depressive and anxiety symptoms that have been interfering with her ability to work.  AT visit August 10, 2018 when for moderately severe anxiety she was instructed to increase sertraline  from 50 to 75 mg daily.   She's only been taking 50 sertraline bc forgot to increase it.  She was also continued on the recently increased Vraylar 6 mg daily.    At visit August 2020.  Patient was encouraged to increase sertraline from 50 to 75 mg again for panic and anxiety.  She had not done so previously as suggested.  She was not improved as of the last visit. Increased sertraline from 50 to 75 for anxiety and panic.  Continued Wellbutrin 450, Vraylar 6, Xanax XR 1 mg TID, Nuvigil 250, Sonata HS.   visit October 19 and the following changes were made: Increase sertraline further to 100 mg in hopes of further reducing generalized anxiety disorder and depression.  The sertraline has blocked of the panic attacks.  It made a little difference but not that much.  Anxiety is worse than depression.   visit February 23, 2019.  She was taking sertraline 100 mg daily.  She felt the sertraline was causing some irritability.  We switched to paroxetine 20 mg.  Noticed a decrease in depression and anxiety and not as irritable with switch to paroxetine.  Tolerating well at HS. Daytime paroxetine dizzy.  Improvement plateau.  dep 4/10.  Anxiety 5/10.   Less work anxiety and a bit more satisfied overall.  Less tearful.   No mood swings.  Anxiety reduced to with work..  No recent panic.  A little more energy.  Still fairly Low motivation and interest.  Still Want to sleep and watch TV.  Stopped exercise bc motivation but trying to get better.  Poor  productivity at home without change. 1 Missed days work DT depression.  Functioning well at work. Intermittent FMLA .  Can struggle to maintain job interest but may be passing.  Been there 10 years.  No mood swings.  Can enjoy things. Good sleep.  Normally 8-9 hours sleep.  On weekend can sleep 10 hours then nap.  Last seen April 06, 2019.  The following change was made:  Increase paroxetine to 30 mg daily to further reduce anxiety and potentially depression .  She remained on Vraylar 6 mg,  Wellbutrin XL 450,  Nuvigil 250 mg, Xanax XR 1 mg 3 times daily. She called back March 11 complaining of feeling numb all the time as well as depression and weight gain.  It was affecting work.  We will put her on the cancellation list to try to get an appointment as soon as possible as there was not an obvious med adjustment that could be made outside of an appointment. She cut back the paroxetine to 20 mg bc gain 15#, feeling numb, and trouble concentrating about March 11.  Doing better overall with SE paroxetine.  A little more focus.  Feeling less numb.  Dep 5/10.  Anxiety 6/10.  Not much progress over the last couple of mos.  Still low interest, enjoyment, motivation.  Missed 1 day in last month but running late and leaving early.  No panic.  Thinks paroxetine helps anxiety some.   Sticking with 3 daily of Xanax XR 1 mg.  Xanax 0.5 about 7 times per week for sleep. Sleep good with more disciplined schedule helped. 8 hours.  If can will sleep a lot.  No NM.  In March 2020 her diagnosis was changed to bipolar depression.    Failed psychiatric medications include sertraline 100, Lexapro, paroxetine 30, fluoxetine, venlafaxine, Wellbutrin 450, duloxetine 120  , sertraline 100 more irritable,  Failed to respond to lexapro plus Rexulti lithium,  lamotrigine doesn't remember why she stopped, valproic acid, Abilify, lithium, Rexulti,  She says the propranolol is not very helpful for anxiety, but taken it once or twice for tremor with anxiety. Had some mood improvement in stability with increase Vraylar to 6. Ambien amnesia  Review of Systems:  Review of Systems  Constitutional: Negative for fatigue.  Gastrointestinal: Negative for diarrhea.  Genitourinary:       Stress diarrhea resolved    Neurological: Negative for dizziness, tremors and weakness.  Psychiatric/Behavioral: Positive for depression and dysphoric mood. Negative for agitation, behavioral problems, confusion, decreased  concentration, hallucinations, self-injury, sleep disturbance and suicidal ideas. The patient is nervous/anxious. The patient is not hyperactive.     Medications: I have reviewed the patient's current medications.  Current Outpatient Medications  Medication Sig Dispense Refill  . ALPRAZolam (ALPRAZOLAM XR) 1 MG 24 hr tablet Take 1 tablet (1 mg total) by mouth 3 (three) times daily. 270 tablet 0  . ALPRAZolam (XANAX) 0.5 MG tablet Take 1 tablet (0.5 mg total) by mouth at bedtime. 90 tablet 0  . Armodafinil 250 MG tablet Take 1 tablet (250 mg total) by mouth daily. 90 tablet 0  . buPROPion (WELLBUTRIN XL) 150 MG 24 hr tablet TAKE 3 TABLETS BY MOUTH EVERY MORNING (Patient taking differently: Take 150 mg by mouth daily. ) 270 tablet 0  . etonogestrel (NEXPLANON) 68 MG IMPL implant 1 each by Subdermal route once.    . Multiple Vitamin (MULITIVITAMIN WITH MINERALS) TABS Take 1 tablet by mouth daily.      Marland Kitchen. PARoxetine (PAXIL)  20 MG tablet Take 1.5 tablets (30 mg total) by mouth daily. (Patient taking differently: Take 20 mg by mouth daily. ) 135 tablet 0  . VRAYLAR 6 MG CAPS TAKE 1 CAPSULE BY MOUTH DAILY. 30 capsule 1  . zaleplon (SONATA) 10 MG capsule TAKE 1 CAPSULE BY MOUTH EVERY NIGHT AT BEDTIME 90 capsule 0   No current facility-administered medications for this visit.    Medication Side Effects: None  Allergies: No Known Allergies  Past Medical History:  Diagnosis Date  . Anxiety   . Chronic shoulder pain   . Depression   . History of tobacco use    Quit 2013    Family History  Problem Relation Age of Onset  . Anxiety disorder Maternal Grandmother   . Mental retardation Maternal Grandmother   . Congestive Heart Failure Maternal Grandfather   . Cancer Maternal Grandfather        lymphoma  . Stroke Paternal Grandfather   . Mental illness Brother        depression  . Hyperlipidemia Brother     Social History   Socioeconomic History  . Marital status: Married    Spouse  name: [name]  . Number of children: 0  . Years of education: College  . Highest education level: Not on file  Occupational History  . Occupation: Building control surveyor: [employer]  Tobacco Use  . Smoking status: Former Games developer  . Smokeless tobacco: Never Used  Substance and Sexual Activity  . Alcohol use: Yes    Alcohol/week: 11.0 standard drinks    Types: 11 Cans of beer per week  . Drug use: No  . Sexual activity: Never    Partners: Male    Comment: NuvaRing  Other Topics Concern  . Not on file  Social History Narrative   Lives with her husband.     Education: [employer]   Exercise: Yes   Social Determinants of Health   Financial Resource Strain:   . Difficulty of Paying Living Expenses:   Food Insecurity:   . Worried About Programme researcher, broadcasting/film/video in the Last Year:   . Barista in the Last Year:   Transportation Needs:   . Freight forwarder (Medical):   Marland Kitchen Lack of Transportation (Non-Medical):   Physical Activity:   . Days of Exercise per Week:   . Minutes of Exercise per Session:   Stress:   . Feeling of Stress :   Social Connections:   . Frequency of Communication with Friends and Family:   . Frequency of Social Gatherings with Friends and Family:   . Attends Religious Services:   . Active Member of Clubs or Organizations:   . Attends Banker Meetings:   Marland Kitchen Marital Status:   Intimate Partner Violence:   . Fear of Current or Ex-Partner:   . Emotionally Abused:   Marland Kitchen Physically Abused:   . Sexually Abused:     Past Medical History, Surgical history, Social history, and Family history were reviewed and updated as appropriate.   Please see review of systems for further details on the patient's review from today.   Objective:   Physical Exam:  There were no vitals taken for this visit.  Physical Exam Neurological:     Mental Status: She is alert and oriented to person, place, and time.     Cranial Nerves: No dysarthria.  Psychiatric:         Attention and Perception: Attention normal.  Mood and Affect: Mood is anxious and depressed. Affect is not tearful.        Speech: Speech normal.        Behavior: Behavior is cooperative.        Thought Content: Thought content normal. Thought content is not paranoid or delusional. Thought content does not include homicidal or suicidal ideation. Thought content does not include homicidal or suicidal plan.        Cognition and Memory: Cognition and memory normal.        Judgment: Judgment normal.     Comments: HYpoverbal. Insight ok     Lab Review:     Component Value Date/Time   NA 137 01/04/2017 1420   K 4.0 01/04/2017 1420   CL 98 01/04/2017 1420   CO2 23 01/04/2017 1420   GLUCOSE 86 01/04/2017 1420   GLUCOSE 76 07/08/2015 1440   BUN 6 01/04/2017 1420   CREATININE 0.71 01/04/2017 1420   CREATININE 0.66 07/08/2015 1440   CALCIUM 9.3 01/04/2017 1420   PROT 7.9 01/04/2017 1420   ALBUMIN 3.9 01/04/2017 1420   AST 22 01/04/2017 1420   ALT 26 01/04/2017 1420   ALKPHOS 99 01/04/2017 1420   BILITOT 0.2 01/04/2017 1420   GFRNONAA 111 01/04/2017 1420   GFRAA 128 01/04/2017 1420       Component Value Date/Time   WBC 7.7 01/04/2017 1420   WBC 6.4 07/08/2015 1440   RBC 4.48 01/04/2017 1420   RBC 4.38 07/08/2015 1440   HGB 14.3 01/04/2017 1420   HCT 40.9 01/04/2017 1420   PLT 363 01/04/2017 1420   MCV 91 01/04/2017 1420   MCH 31.9 01/04/2017 1420   MCH 32.4 07/08/2015 1440   MCHC 35.0 01/04/2017 1420   MCHC 34.4 07/08/2015 1440   RDW 14.3 01/04/2017 1420   LYMPHSABS 3.0 01/04/2017 1420   MONOABS 384 07/08/2015 1440   EOSABS 0.0 01/04/2017 1420   BASOSABS 0.0 01/04/2017 1420    No results found for: POCLITH, LITHIUM   No results found for: PHENYTOIN, PHENOBARB, VALPROATE, CBMZ   .res Assessment: Plan:    Ellicia was seen today for follow-up, anxiety and depression.  Diagnoses and all orders for this visit:  Severe bipolar I disorder, current or most  recent episode depressed (HCC)  Generalized anxiety disorder  Panic disorder with agoraphobia  Insomnia due to mental condition  Shift work sleep disorder   Bryson had her diagnosis changed from major depression to bipolar depression in March.   Her depression and anxiety had been treatment resistant.  Her depression, panic and generalized anxiety disorder are all chronic. She has seen gradual improvement with the med changes at the last visit increasing Vraylar to 6 mg a day and adding and increasing sertraline.  Her symptoms of depression and anxiety have improved from severe to moderate she has become less productive at home.  She still having a lot of anxiety about work.  And she still depressed.  At her last appointment in December because of increased irritability with sertraline the decision was made to switch to paroxetine.  She has since noted further reduction in depression and anxiety.  Anxiety is still at a moderate level.  The improvement is appear to plateau.  She is tolerating the paroxetine well. Increase paroxetine to 30 mg daily to further reduce anxiety and potentially depression.  Disc risk cylcing and SE.   Option gabapentin.  The alternative of gabapentin would take a longer period of adjustment and is unlikely to  have any potential to help with depressive symptoms.It is difficult to treat panic and anxiety and bipolar patient sometimes because the most effective panic meds are SSRIs which can cause mood swings and bipolar patients.  We may be able to get by with a low dose of paroxetine without triggering mood swings since she has not had major mood swings with the sertraline. She did not see a positive response from propranolol but it could be considered again at a higher dosage for her severe anxiety.  At one time she was using the Xanax excessively but she is not doing so now.  She still requires it.  She was able to get the dosage back down to the prescribed dosage.   Discussed the addiction risk and withdrawal from that. We discussed the short-term risks associated with benzodiazepines including sedation and increased fall risk among others.  Discussed long-term side effect risk including dependence, potential withdrawal symptoms, and the potential eventual dose-related risk of dementia. Disc risks of Xanax and sonata but she's tolerating it and will continue bc recent severe sx.  Disc amnesia.  In the future we may attempt to gradually reduce this further.  Discussed potential metabolic side effects associated with atypical antipsychotics, as well as potential risk for movement side effects. Advised pt to contact office if movement side effects occur.   For treatment resistant bipolar depression consider switch to [facility] though she has had a partial response with Vraylar or Seroquel Exar.  Statistically these are the options remaining that have the best odds of helping her bipolar depression. Consider ECT.  She prefers Seroquel bc it's generic. DC Vraylar Start Seroquel XR 150 HS Stop Wellbutrin SL .  She reports only taking 150 for months. No apparent benefit. Stop Xanax !R for sleep.  If no improvement in 2 to 3 weeks but tolerated well call and we will increase the Seroquel Exar to 300 mg.  150 mg is on the low end of the dosage range.  Discussed side effects in detail.  Shift work managed with Nuvigil  FU 6 weeks  Meredith Staggers, MD, DFAPA   Please see After Visit Summary for patient specific instructions.  No future appointments.  No orders of the defined types were placed in this encounter.     -------------------------------

## 2019-06-07 ENCOUNTER — Telehealth: Payer: Self-pay | Admitting: Psychiatry

## 2019-06-07 NOTE — Telephone Encounter (Signed)
Left message to call back about her questions

## 2019-06-07 NOTE — Telephone Encounter (Signed)
Patient called and left a message and said that at her last visit with dr. Jennelle Human she didn;t write down the medicines she is suppose to take and and the dosage. Could you call her and go over it with her. She works 2nd shift so she sleeps in the am.Her phone number is [phone]

## 2019-06-07 NOTE — Telephone Encounter (Signed)
Patient called back and asking which medications she was to stop, front office informed her she was to stop Wellbutrin, Xanax IR, and Vraylar and to start Seroquel XR. Patient verbalized understanding.

## 2019-06-11 ENCOUNTER — Telehealth: Payer: Self-pay | Admitting: Psychiatry

## 2019-06-11 NOTE — Telephone Encounter (Signed)
RTC to patient and she wrote down all the changes at last office visit on 05/31/2019, verbalized understanding.

## 2019-06-11 NOTE — Telephone Encounter (Signed)
Pt called again, stated she needs to write down her meds and when to take them. Return call [phone]

## 2019-06-11 NOTE — Telephone Encounter (Signed)
Left patient message to call back about her questions. Spoke with her this past Thursday for clarification from her last office visit as well.

## 2019-06-11 NOTE — Telephone Encounter (Signed)
Patient left a message and said that she would like the nurse to call her back. She has questions about her medicine Please call her at [phone]

## 2019-07-11 ENCOUNTER — Telehealth (INDEPENDENT_AMBULATORY_CARE_PROVIDER_SITE_OTHER): Payer: No Typology Code available for payment source | Admitting: Psychiatry

## 2019-07-11 ENCOUNTER — Telehealth: Payer: Self-pay | Admitting: Psychiatry

## 2019-07-11 ENCOUNTER — Encounter: Payer: Self-pay | Admitting: Psychiatry

## 2019-07-11 DIAGNOSIS — G4726 Circadian rhythm sleep disorder, shift work type: Secondary | ICD-10-CM

## 2019-07-11 DIAGNOSIS — F411 Generalized anxiety disorder: Secondary | ICD-10-CM

## 2019-07-11 DIAGNOSIS — F4001 Agoraphobia with panic disorder: Secondary | ICD-10-CM

## 2019-07-11 DIAGNOSIS — F314 Bipolar disorder, current episode depressed, severe, without psychotic features: Secondary | ICD-10-CM | POA: Diagnosis not present

## 2019-07-11 DIAGNOSIS — F5105 Insomnia due to other mental disorder: Secondary | ICD-10-CM

## 2019-07-11 MED ORDER — QUETIAPINE FUMARATE ER 300 MG PO TB24: 300.0000 mg | ORAL_TABLET | Freq: Every day | ORAL | 1 refills | Status: DC

## 2019-07-11 NOTE — Telephone Encounter (Signed)
Ms. edison, wollschlager are scheduled for a virtual visit with your provider today.    Just as we do with appointments in the office, we must obtain your consent to participate.  Your consent will be active for this visit and any virtual visit you may have with one of our providers in the next 365 days.    If you have a MyChart account, I can also send a copy of this consent to you electronically.  All virtual visits are billed to your insurance company just like a traditional visit in the office.  As this is a virtual visit, video technology does not allow for your provider to perform a traditional examination.  This may limit your provider's ability to fully assess your condition.  If your provider identifies any concerns that need to be evaluated in person or the need to arrange testing such as labs, EKG, etc, we will make arrangements to do so.    Although advances in technology are sophisticated, we cannot ensure that it will always work on either your end or our end.  If the connection with a video visit is poor, we may have to switch to a telephone visit.  With either a video or telephone visit, we are not always able to ensure that we have a secure connection.   I need to obtain your verbal consent now.   Are you willing to proceed with your visit today?   Shamera Yarberry has provided verbal consent on 07/11/2019 for a virtual visit (video or telephone).   Lauraine Rinne, MD 07/11/2019  12:20 PM  .

## 2019-07-11 NOTE — Progress Notes (Signed)
Zoe Ryan [id number] 03/17/1980 39 y.o.   Virtual Visit via Telephone Note  I connected with pt by telephone and verified that I am speaking with the correct person using two identifiers.   I discussed the limitations, risks, security and privacy concerns of performing an evaluation and management service by telephone and the availability of in person appointments. I also discussed with the patient that there may be a patient responsible charge related to this service. The patient expressed understanding and agreed to proceed.  I discussed the assessment and treatment plan with the patient. The patient was provided an opportunity to ask questions and all were answered. The patient agreed with the plan and demonstrated an understanding of the instructions.   The patient was advised to call back or seek an in-person evaluation if the symptoms worsen or if the condition fails to improve as anticipated.  I provided 30 minutes of non-face-to-face time during this encounter. The call started at 1200 and ended at 1230. The patient was located at home and the provider was located office.   Subjective:   Patient ID:  Zoe Ryan is a 39 y.o. (DOB October 20, 1980) female.  Chief Complaint:  Chief Complaint  Patient presents with  . Follow-up  . Depression  . Anxiety    Anxiety Symptoms include nervous/anxious behavior. Patient reports no confusion, decreased concentration, dizziness or suicidal ideas.    Depression        Associated symptoms include no decreased concentration, no fatigue and no suicidal ideas.  Past medical history includes anxiety.      Zoe Ryan is seen again today urgently because of recent severe TR bipolar depressive and anxiety symptoms that have been interfering with her ability to work.  AT visit August 10, 2018 when for moderately severe anxiety she was instructed to increase sertraline  from 50 to 75 mg daily.  She's only been taking 50 sertraline bc forgot to  increase it.  She was also continued on the recently increased Vraylar 6 mg daily.    At visit August 2020.  Patient was encouraged to increase sertraline from 50 to 75 mg again for panic and anxiety.  She had not done so previously as suggested.  She was not improved as of the last visit. Increased sertraline from 50 to 75 for anxiety and panic.  Continued Wellbutrin 450, Vraylar 6, Xanax XR 1 mg TID, Nuvigil 250, Sonata HS.   visit October 19 and the following changes were made: Increase sertraline further to 100 mg in hopes of further reducing generalized anxiety disorder and depression.  The sertraline has blocked of the panic attacks.  It made a little difference but not that much.  Anxiety is worse than depression.   visit February 23, 2019.  She was taking sertraline 100 mg daily.  She felt the sertraline was causing some irritability.  We switched to paroxetine 20 mg.  Noticed a decrease in depression and anxiety and not as irritable with switch to paroxetine.  Tolerating well at HS. Daytime paroxetine dizzy.  Improvement plateau.  dep 4/10.  Anxiety 5/10.   Less work anxiety and a bit more satisfied overall.  Less tearful.   No mood swings.  Anxiety reduced to with work..  No recent panic.  A little more energy.  Still fairly Low motivation and interest.  Still Want to sleep and watch TV.  Stopped exercise bc motivation but trying to get better.  Poor productivity at home without change. 1 Missed days work DT  depression.  Functioning well at work. Intermittent FMLA .  Can struggle to maintain job interest but may be passing.  Been there 10 years.  No mood swings.  Can enjoy things. Good sleep.  Normally 8-9 hours sleep.  On weekend can sleep 10 hours then nap.  seen April 06, 2019.  The following change was made:  Increase paroxetine to 30 mg daily to further reduce anxiety and potentially depression .  She remained on Vraylar 6 mg, Wellbutrin XL 450,  Nuvigil 250 mg, Xanax XR 1 mg 3 times  daily. She called back March 11 complaining of feeling numb all the time as well as depression and weight gain.  It was affecting work.  We will put her on the cancellation list to try to get an appointment as soon as possible as there was not an obvious med adjustment that could be made outside of an appointment. She cut back the paroxetine to 20 mg bc gain 15#, feeling numb, and trouble concentrating about March 11.  Last seen May 31, 2019.  The following was noted: Doing better overall with SE paroxetine.  A little more focus.  Feeling less numb.  Dep 5/10.  Anxiety 6/10.  Not much progress over the last couple of mos.  Still low interest, enjoyment, motivation.  Missed 1 day in last month but running late and leaving early.  No panic.  Thinks paroxetine helps anxiety some.  Sticking with 3 daily of Xanax XR 1 mg.  Xanax 0.5 about 7 times per week for sleep. Sleep good with more disciplined schedule helped. 8 hours.  If can will sleep a lot.  No NM. Overall her bipolar depression with anxiety was ongoing and the decision was made to change mood stabilizer. The following changes were made: DC Vraylar Start Seroquel XR 150 HS Stop Wellbutrin SL .  She reports only taking 150 for months. No apparent benefit. Stop Xanax !R for sleep.  07/11/2019 appointment the following is noted: Did not stop Xanax 0.5 mg HS.  Was able to reduce Xanax XR to about 1 mg daily. Anxiety has gone down especially in the morning.  Not much change in depression.  Still tired a lot and low motivation.  Limited enjoyment.  Dep 5/10.  Anxiety 4/10. No panic lately.   More sleep and a lot  without NM.  Work function is alright.  Not missing much work but a little.  Tolerable SE.  No mood swings.  In March 2020 her diagnosis was changed to bipolar depression.    Failed psychiatric medications include sertraline 100, Lexapro, paroxetine 30, fluoxetine, venlafaxine, Wellbutrin 450, duloxetine 120  , sertraline 100 more  irritable,  Failed to respond to lexapro plus Rexulti lithium,  lamotrigine doesn't remember why she stopped, valproic acid, Abilify, lithium, Rexulti,  Had some mood improvement in stability with increase Vraylar to 6. She says the propranolol is not very helpful for anxiety, but taken it once or twice for tremor with anxiety.  Ambien amnesia  Review of Systems:  Review of Systems  Constitutional: Negative for fatigue.  Gastrointestinal: Negative for diarrhea.  Genitourinary:       Stress diarrhea resolved    Neurological: Negative for dizziness, tremors and weakness.  Psychiatric/Behavioral: Positive for depression and dysphoric mood. Negative for agitation, behavioral problems, confusion, decreased concentration, hallucinations, self-injury, sleep disturbance and suicidal ideas. The patient is nervous/anxious. The patient is not hyperactive.     Medications: I have reviewed the patient's current medications.  Current  Outpatient Medications  Medication Sig Dispense Refill  . ALPRAZolam (ALPRAZOLAM XR) 1 MG 24 hr tablet Take 1 tablet (1 mg total) by mouth 3 (three) times daily. (Patient taking differently: Take 1 mg by mouth 3 (three) times daily. Usually 1 daily) 270 tablet 0  . ALPRAZolam (XANAX) 0.5 MG tablet Take 1 tablet (0.5 mg total) by mouth at bedtime. 90 tablet 0  . Armodafinil 250 MG tablet Take 1 tablet (250 mg total) by mouth daily. 90 tablet 0  . etonogestrel (NEXPLANON) 68 MG IMPL implant 1 each by Subdermal route once.    . Multiple Vitamin (MULITIVITAMIN WITH MINERALS) TABS Take 1 tablet by mouth daily.      Marland Kitchen PARoxetine (PAXIL) 20 MG tablet Take 1.5 tablets (30 mg total) by mouth daily. 135 tablet 0  . QUEtiapine (SEROQUEL XR) 300 MG 24 hr tablet Take 1 tablet (300 mg total) by mouth at bedtime. 30 tablet 1  . zaleplon (SONATA) 10 MG capsule TAKE 1 CAPSULE BY MOUTH EVERY NIGHT AT BEDTIME 90 capsule 0   No current facility-administered medications for this visit.     Medication Side Effects: dry mouth  Allergies: No Known Allergies  Past Medical History:  Diagnosis Date  . Anxiety   . Chronic shoulder pain   . Depression   . History of tobacco use    Quit 2013    Family History  Problem Relation Age of Onset  . Anxiety disorder Maternal Grandmother   . Mental retardation Maternal Grandmother   . Congestive Heart Failure Maternal Grandfather   . Cancer Maternal Grandfather        lymphoma  . Stroke Paternal Grandfather   . Mental illness Brother        depression  . Hyperlipidemia Brother     Social History   Socioeconomic History  . Marital status: Married    Spouse name: Marylyn Ishihara  . Number of children: 0  . Years of education: College  . Highest education level: Not on file  Occupational History  . Occupation: Firefighter: [employer]  Tobacco Use  . Smoking status: Former Research scientist (life sciences)  . Smokeless tobacco: Never Used  Substance and Sexual Activity  . Alcohol use: Yes    Alcohol/week: 11.0 standard drinks    Types: 11 Cans of beer per week  . Drug use: No  . Sexual activity: Never    Partners: Male    Comment: NuvaRing  Other Topics Concern  . Not on file  Social History Narrative   Lives with her husband.     Education: [employer]   Exercise: Yes   Social Determinants of Health   Financial Resource Strain:   . Difficulty of Paying Living Expenses:   Food Insecurity:   . Worried About Charity fundraiser in the Last Year:   . Arboriculturist in the Last Year:   Transportation Needs:   . Film/video editor (Medical):   Marland Kitchen Lack of Transportation (Non-Medical):   Physical Activity:   . Days of Exercise per Week:   . Minutes of Exercise per Session:   Stress:   . Feeling of Stress :   Social Connections:   . Frequency of Communication with Friends and Family:   . Frequency of Social Gatherings with Friends and Family:   . Attends Religious Services:   . Active Member of Clubs or Organizations:   .  Attends Archivist Meetings:   Marland Kitchen Marital Status:  Intimate Partner Violence:   . Fear of Current or Ex-Partner:   . Emotionally Abused:   Marland Kitchen. Physically Abused:   . Sexually Abused:     Past Medical History, Surgical history, Social history, and Family history were reviewed and updated as appropriate.   Please see review of systems for further details on the patient's review from today.   Objective:   Physical Exam:  There were no vitals taken for this visit.  Physical Exam Neurological:     Mental Status: She is alert and oriented to person, place, and time.     Cranial Nerves: No dysarthria.  Psychiatric:        Attention and Perception: Attention normal.        Mood and Affect: Mood is anxious and depressed. Affect is not tearful.        Speech: Speech normal.        Behavior: Behavior is cooperative.        Thought Content: Thought content normal. Thought content is not paranoid or delusional. Thought content does not include homicidal or suicidal ideation. Thought content does not include homicidal or suicidal plan.        Cognition and Memory: Cognition and memory normal.        Judgment: Judgment normal.     Comments: HYpoverbal. Insight ok     Lab Review:     Component Value Date/Time   NA 137 01/04/2017 1420   K 4.0 01/04/2017 1420   CL 98 01/04/2017 1420   CO2 23 01/04/2017 1420   GLUCOSE 86 01/04/2017 1420   GLUCOSE 76 07/08/2015 1440   BUN 6 01/04/2017 1420   CREATININE 0.71 01/04/2017 1420   CREATININE 0.66 07/08/2015 1440   CALCIUM 9.3 01/04/2017 1420   PROT 7.9 01/04/2017 1420   ALBUMIN 3.9 01/04/2017 1420   AST 22 01/04/2017 1420   ALT 26 01/04/2017 1420   ALKPHOS 99 01/04/2017 1420   BILITOT 0.2 01/04/2017 1420   GFRNONAA 111 01/04/2017 1420   GFRAA 128 01/04/2017 1420       Component Value Date/Time   WBC 7.7 01/04/2017 1420   WBC 6.4 07/08/2015 1440   RBC 4.48 01/04/2017 1420   RBC 4.38 07/08/2015 1440   HGB 14.3 01/04/2017  1420   HCT 40.9 01/04/2017 1420   PLT 363 01/04/2017 1420   MCV 91 01/04/2017 1420   MCH 31.9 01/04/2017 1420   MCH 32.4 07/08/2015 1440   MCHC 35.0 01/04/2017 1420   MCHC 34.4 07/08/2015 1440   RDW 14.3 01/04/2017 1420   LYMPHSABS 3.0 01/04/2017 1420   MONOABS 384 07/08/2015 1440   EOSABS 0.0 01/04/2017 1420   BASOSABS 0.0 01/04/2017 1420    No results found for: POCLITH, LITHIUM   No results found for: PHENYTOIN, PHENOBARB, VALPROATE, CBMZ   .res Assessment: Plan:    [name] was seen today for follow-up, depression and anxiety.  Diagnoses and all orders for this visit:  Severe bipolar I disorder, current or most recent episode depressed (HCC) -     QUEtiapine (SEROQUEL XR) 300 MG 24 hr tablet; Take 1 tablet (300 mg total) by mouth at bedtime.  Generalized anxiety disorder -     QUEtiapine (SEROQUEL XR) 300 MG 24 hr tablet; Take 1 tablet (300 mg total) by mouth at bedtime.  Panic disorder with agoraphobia  Insomnia due to mental condition -     QUEtiapine (SEROQUEL XR) 300 MG 24 hr tablet; Take 1 tablet (300 mg total) by mouth  at bedtime.  Shift work sleep disorder   Bettyjo had her diagnosis changed from major depression to bipolar depression in March.   Her depression and anxiety had been treatment resistant.  Her depression, panic and generalized anxiety disorder are all chronic.  As of her last appointment in March to this appointment anxiety has improved from 6/10 to 4/10 with the paroxetine 30 mg and the switch from Vraylar to Seroquel XR 150 mg daily.  However depression has not improved at all.  She is tolerating the current medications.  She is not having mood swings.  Work function is okay.   Option gabapentin.  The alternative of gabapentin would take a longer period of adjustment and is unlikely to have any potential to help with depressive symptoms.    She has been able to reduce the daytime dose of Xanax XR substantially down to about 1 mg a day from 3 mg  daily.  Discussed potential metabolic side effects associated with atypical antipsychotics, as well as potential risk for movement side effects. Advised pt to contact office if movement side effects occur.   For treatment resistant bipolar depression consider switch to [facility] though she has had a partial response with Vraylar or Seroquel Exar.  Statistically these are the options remaining that have the best odds of helping her bipolar depression. Consider ECT.  Increase Seroquel XR to 300 mg daily for TR bipolar depression.  Disc SE.   Try to wean HS Xanax.  Shift work managed with Nuvigil  FU 4 weeks  Meredith Staggers, MD, DFAPA   Please see After Visit Summary for patient specific instructions.  No future appointments.  No orders of the defined types were placed in this encounter.     -------------------------------

## 2019-07-24 ENCOUNTER — Other Ambulatory Visit: Payer: Self-pay | Admitting: Psychiatry

## 2019-07-24 DIAGNOSIS — F5105 Insomnia due to other mental disorder: Secondary | ICD-10-CM

## 2019-07-24 DIAGNOSIS — F411 Generalized anxiety disorder: Secondary | ICD-10-CM

## 2019-07-24 DIAGNOSIS — F4001 Agoraphobia with panic disorder: Secondary | ICD-10-CM

## 2019-07-24 NOTE — Telephone Encounter (Signed)
90 day refills, next visit 06/10

## 2019-08-16 ENCOUNTER — Encounter: Payer: Self-pay | Admitting: Psychiatry

## 2019-08-16 ENCOUNTER — Telehealth (INDEPENDENT_AMBULATORY_CARE_PROVIDER_SITE_OTHER): Payer: No Typology Code available for payment source | Admitting: Psychiatry

## 2019-08-16 DIAGNOSIS — F411 Generalized anxiety disorder: Secondary | ICD-10-CM | POA: Diagnosis not present

## 2019-08-16 DIAGNOSIS — F4001 Agoraphobia with panic disorder: Secondary | ICD-10-CM | POA: Diagnosis not present

## 2019-08-16 DIAGNOSIS — F314 Bipolar disorder, current episode depressed, severe, without psychotic features: Secondary | ICD-10-CM

## 2019-08-16 DIAGNOSIS — F5105 Insomnia due to other mental disorder: Secondary | ICD-10-CM

## 2019-08-16 DIAGNOSIS — G4726 Circadian rhythm sleep disorder, shift work type: Secondary | ICD-10-CM

## 2019-08-16 MED ORDER — QUETIAPINE FUMARATE ER 300 MG PO TB24: 600.0000 mg | ORAL_TABLET | Freq: Every day | ORAL | 1 refills | Status: DC

## 2019-08-16 MED ORDER — ARMODAFINIL 250 MG PO TABS: 250.0000 mg | ORAL_TABLET | Freq: Every day | ORAL | 0 refills | Status: DC

## 2019-08-16 MED ORDER — PAROXETINE HCL 20 MG PO TABS: 30.0000 mg | ORAL_TABLET | Freq: Every day | ORAL | 0 refills | Status: DC

## 2019-08-16 NOTE — Patient Instructions (Signed)
Call before July 1 if no improvement and we'll consider increasing the Seroquel XR to 800 mg daily.

## 2019-08-16 NOTE — Progress Notes (Signed)
Zoe Ryan [id number] February 05, 1981 39 y.o.   I connected with  Blakeleigh Domek on 08/16/19 by a video enabled telemedicine application and verified that I am speaking with the correct person using two identifiers.   I discussed the limitations of evaluation and management by telemedicine. The patient expressed understanding and agreed to proceed.    Subjective:   Patient ID:  Zoe Ryan is a 39 y.o. (DOB [birth year]) female.  Chief Complaint:  Chief Complaint  Patient presents with  . Follow-up    med change  . Depression  . Anxiety    Anxiety Symptoms include nervous/anxious behavior. Patient reports no confusion, decreased concentration, dizziness or suicidal ideas.    Depression        Associated symptoms include no decreased concentration, no fatigue and no suicidal ideas.  Past medical history includes anxiety.      Zoe Ryan is seen again today urgently because of recent severe TR bipolar depressive and anxiety symptoms that have been interfering with her ability to work.  AT visit August 10, 2018 when for moderately severe anxiety she was instructed to increase sertraline  from 50 to 75 mg daily.  She's only been taking 50 sertraline bc forgot to increase it.  She was also continued on the recently increased Vraylar 6 mg daily.    At visit August 2020.  Patient was encouraged to increase sertraline from 50 to 75 mg again for panic and anxiety.  She had not done so previously as suggested.  She was not improved as of the last visit. Increased sertraline from 50 to 75 for anxiety and panic.  Continued Wellbutrin 450, Vraylar 6, Xanax XR 1 mg TID, Nuvigil 250, Sonata HS.   visit October 19 and the following changes were made: Increase sertraline further to 100 mg in hopes of further reducing generalized anxiety disorder and depression.  The sertraline has blocked of the panic attacks.  It made a little difference but not that much.  Anxiety is worse than depression.   visit  February 23, 2019.  She was taking sertraline 100 mg daily.  She felt the sertraline was causing some irritability.  We switched to paroxetine 20 mg.  Noticed a decrease in depression and anxiety and not as irritable with switch to paroxetine.  Tolerating well at HS. Daytime paroxetine dizzy.  Improvement plateau.  dep 4/10.  Anxiety 5/10.   Less work anxiety and a bit more satisfied overall.  Less tearful.   No mood swings.  Anxiety reduced to with work..  No recent panic.  A little more energy.  Still fairly Low motivation and interest.  Still Want to sleep and watch TV.  Stopped exercise bc motivation but trying to get better.  Poor productivity at home without change. 1 Missed days work DT depression.  Functioning well at work. Intermittent FMLA .  Can struggle to maintain job interest but may be passing.  Been there 10 years.  No mood swings.  Can enjoy things. Good sleep.  Normally 8-9 hours sleep.  On weekend can sleep 10 hours then nap.  seen April 06, 2019.  The following change was made:  Increase paroxetine to 30 mg daily to further reduce anxiety and potentially depression .  She remained on Vraylar 6 mg, Wellbutrin XL 450,  Nuvigil 250 mg, Xanax XR 1 mg 3 times daily. She called back March 11 complaining of feeling numb all the time as well as depression and weight gain.  It was affecting work.  We will put her on the cancellation list to try to get an appointment as soon as possible as there was not an obvious med adjustment that could be made outside of an appointment. She cut back the paroxetine to 20 mg bc gain 15#, feeling numb, and trouble concentrating about March 11.   07/11/2019 appointment with the following noted: Doing better overall with SE paroxetine.  A little more focus.  Feeling less numb.  Dep 5/10.  Anxiety 6/10.  Not much progress over the last couple of mos.  Still low interest, enjoyment, motivation.  Missed 1 day in last month but running late and leaving early.  No panic.   Thinks paroxetine helps anxiety some.  Sticking with 3 daily of Xanax XR 1 mg.  Xanax 0.5 about 7 times per week for sleep. Sleep good with more disciplined schedule helped. 8 hours.  If can will sleep a lot.  No NM. Plan:Increase Seroquel XR to 300 mg daily for TR bipolar depression.  Disc SE.   Try to wean HS Xanax.  08/16/2019 appointment with the following noted: No additional improvement with increase Seroquel but no addional SE either. Dep & anxiety 6-7/10 worse than last time. Work function reduced and had to leave early a couple of days.  Couple of panic before work. Sleep variable from normal to excessive.  Started a new diet with restrictive calories and reinjured knee only sig stressors.  In March 2020 her diagnosis was changed to bipolar depression.    Failed psychiatric medications include sertraline 100, Lexapro, paroxetine 30, fluoxetine, venlafaxine, Wellbutrin 450, duloxetine 120  , sertraline 100 more irritable,  Failed to respond to lexapro plus Rexulti lithium,  lamotrigine doesn't remember why she stopped, valproic acid, Abilify, lithium, Rexulti,  She says the propranolol is not very helpful for anxiety, but taken it once or twice for tremor with anxiety. Had some mood improvement in stability with increase Vraylar to 6. Ambien amnesia  Review of Systems:  Review of Systems  Constitutional: Negative for fatigue.  Gastrointestinal: Negative for diarrhea.  Genitourinary:       Stress diarrhea resolved    Musculoskeletal: Positive for arthralgias.  Neurological: Negative for dizziness, tremors and weakness.  Psychiatric/Behavioral: Positive for depression and dysphoric mood. Negative for agitation, behavioral problems, confusion, decreased concentration, hallucinations, self-injury, sleep disturbance and suicidal ideas. The patient is nervous/anxious. The patient is not hyperactive.     Medications: I have reviewed the patient's current medications.  Current  Outpatient Medications  Medication Sig Dispense Refill  . ALPRAZolam (XANAX) 0.5 MG tablet TAKE 1 TABLET BY MOUTH AT BEDTIME. 90 tablet 0  . ALPRAZOLAM XR 1 MG 24 hr tablet TAKE 1 TABLET BY MOUTH 3 TIMES DAILY. 270 tablet 0  . Armodafinil 250 MG tablet Take 1 tablet (250 mg total) by mouth daily. 90 tablet 0  . etonogestrel (NEXPLANON) 68 MG IMPL implant 1 each by Subdermal route once.    . Multiple Vitamin (MULITIVITAMIN WITH MINERALS) TABS Take 1 tablet by mouth daily.      Marland Kitchen. PARoxetine (PAXIL) 20 MG tablet Take 1.5 tablets (30 mg total) by mouth daily. 135 tablet 0  . QUEtiapine (SEROQUEL XR) 300 MG 24 hr tablet Take 2 tablets (600 mg total) by mouth at bedtime. 60 tablet 1  . zaleplon (SONATA) 10 MG capsule TAKE 1 CAPSULE BY MOUTH EVERY NIGHT AT BEDTIME (Patient not taking: Reported on 08/16/2019) 90 capsule 0   No current facility-administered medications for this visit.  Medication Side Effects: None  Allergies: No Known Allergies  Past Medical History:  Diagnosis Date  . Anxiety   . Chronic shoulder pain   . Depression   . History of tobacco use    Quit 2013    Family History  Problem Relation Age of Onset  . Anxiety disorder Maternal Grandmother   . Mental retardation Maternal Grandmother   . Congestive Heart Failure Maternal Grandfather   . Cancer Maternal Grandfather        lymphoma  . Stroke Paternal Grandfather   . Mental illness Brother        depression  . Hyperlipidemia Brother     Social History   Socioeconomic History  . Marital status: Married    Spouse name: Marylyn Ishihara  . Number of children: 0  . Years of education: College  . Highest education level: Not on file  Occupational History  . Occupation: Firefighter: [employer]  Tobacco Use  . Smoking status: Former Research scientist (life sciences)  . Smokeless tobacco: Never Used  Vaping Use  . Vaping Use: Every day  Substance and Sexual Activity  . Alcohol use: Yes    Alcohol/week: 11.0 standard drinks     Types: 11 Cans of beer per week  . Drug use: No  . Sexual activity: Never    Partners: Male    Comment: NuvaRing  Other Topics Concern  . Not on file  Social History Narrative   Lives with her husband.     Education: [employer]   Exercise: Yes   Social Determinants of Health   Financial Resource Strain:   . Difficulty of Paying Living Expenses:   Food Insecurity:   . Worried About Charity fundraiser in the Last Year:   . Arboriculturist in the Last Year:   Transportation Needs:   . Film/video editor (Medical):   Marland Kitchen Lack of Transportation (Non-Medical):   Physical Activity:   . Days of Exercise per Week:   . Minutes of Exercise per Session:   Stress:   . Feeling of Stress :   Social Connections:   . Frequency of Communication with Friends and Family:   . Frequency of Social Gatherings with Friends and Family:   . Attends Religious Services:   . Active Member of Clubs or Organizations:   . Attends Archivist Meetings:   Marland Kitchen Marital Status:   Intimate Partner Violence:   . Fear of Current or Ex-Partner:   . Emotionally Abused:   Marland Kitchen Physically Abused:   . Sexually Abused:     Past Medical History, Surgical history, Social history, and Family history were reviewed and updated as appropriate.   Please see review of systems for further details on the patient's review from today.   Objective:   Physical Exam:  There were no vitals taken for this visit.  Physical Exam Neurological:     Mental Status: She is alert and oriented to person, place, and time.     Cranial Nerves: No dysarthria.  Psychiatric:        Attention and Perception: Attention normal.        Mood and Affect: Mood is anxious and depressed. Affect is not tearful.        Speech: Speech normal.        Behavior: Behavior is cooperative.        Thought Content: Thought content normal. Thought content is not paranoid or delusional. Thought content does not include  homicidal or suicidal ideation.  Thought content does not include homicidal or suicidal plan.        Cognition and Memory: Cognition and memory normal.        Judgment: Judgment normal.     Comments: HYpoverbal. Insight ok More depressed lately      Review of Systems:  Review of Systems  Constitutional: Negative for fatigue.  Gastrointestinal: Negative for diarrhea.  Genitourinary:       Stress diarrhea resolved    Neurological: Negative for dizziness, tremors and weakness.  Psychiatric/Behavioral: Positive for depression and dysphoric mood. Negative for agitation, behavioral problems, confusion, decreased concentration, hallucinations, self-injury, sleep disturbance and suicidal ideas. The patient is nervous/anxious. The patient is not hyperactive.     Medications: I have reviewed the patient's current medications.  Current Outpatient Medications  Medication Sig Dispense Refill  . ALPRAZolam (XANAX) 0.5 MG tablet TAKE 1 TABLET BY MOUTH AT BEDTIME. 90 tablet 0  . ALPRAZOLAM XR 1 MG 24 hr tablet TAKE 1 TABLET BY MOUTH 3 TIMES DAILY. 270 tablet 0  . Armodafinil 250 MG tablet Take 1 tablet (250 mg total) by mouth daily. 90 tablet 0  . etonogestrel (NEXPLANON) 68 MG IMPL implant 1 each by Subdermal route once.    . Multiple Vitamin (MULITIVITAMIN WITH MINERALS) TABS Take 1 tablet by mouth daily.      Marland Kitchen PARoxetine (PAXIL) 20 MG tablet Take 1.5 tablets (30 mg total) by mouth daily. 135 tablet 0  . QUEtiapine (SEROQUEL XR) 300 MG 24 hr tablet Take 2 tablets (600 mg total) by mouth at bedtime. 60 tablet 1  . zaleplon (SONATA) 10 MG capsule TAKE 1 CAPSULE BY MOUTH EVERY NIGHT AT BEDTIME (Patient not taking: Reported on 08/16/2019) 90 capsule 0   No current facility-administered medications for this visit.    Medication Side Effects: dry mouth  Allergies: No Known Allergies  Past Medical History:  Diagnosis Date  . Anxiety   . Chronic shoulder pain   . Depression   . History of tobacco use    Quit 2013     Family History  Problem Relation Age of Onset  . Anxiety disorder Maternal Grandmother   . Mental retardation Maternal Grandmother   . Congestive Heart Failure Maternal Grandfather   . Cancer Maternal Grandfather        lymphoma  . Stroke Paternal Grandfather   . Mental illness Brother        depression  . Hyperlipidemia Brother     Social History   Socioeconomic History  . Marital status: Married    Spouse name: [name]  . Number of children: 0  . Years of education: College  . Highest education level: Not on file  Occupational History  . Occupation: Building control surveyor: [employer]  Tobacco Use  . Smoking status: Former Games developer  . Smokeless tobacco: Never Used  Vaping Use  . Vaping Use: Every day  Substance and Sexual Activity  . Alcohol use: Yes    Alcohol/week: 11.0 standard drinks    Types: 11 Cans of beer per week  . Drug use: No  . Sexual activity: Never    Partners: Male    Comment: NuvaRing  Other Topics Concern  . Not on file  Social History Narrative   Lives with her husband.     Education: [employer]   Exercise: Yes   Social Determinants of Health   Financial Resource Strain:   . Difficulty of Paying Living Expenses:  Food Insecurity:   . Worried About Programme researcher, broadcasting/film/video in the Last Year:   . Barista in the Last Year:   Transportation Needs:   . Freight forwarder (Medical):   Marland Kitchen Lack of Transportation (Non-Medical):   Physical Activity:   . Days of Exercise per Week:   . Minutes of Exercise per Session:   Stress:   . Feeling of Stress :   Social Connections:   . Frequency of Communication with Friends and Family:   . Frequency of Social Gatherings with Friends and Family:   . Attends Religious Services:   . Active Member of Clubs or Organizations:   . Attends Banker Meetings:   Marland Kitchen Marital Status:   Intimate Partner Violence:   . Fear of Current or Ex-Partner:   . Emotionally Abused:   Marland Kitchen Physically Abused:    . Sexually Abused:     Past Medical History, Surgical history, Social history, and Family history were reviewed and updated as appropriate.   Please see review of systems for further details on the patient's review from today.   Objective:   Physical Exam:  There were no vitals taken for this visit.  Physical Exam Neurological:     Mental Status: She is alert and oriented to person, place, and time.     Cranial Nerves: No dysarthria.  Psychiatric:        Attention and Perception: Attention normal.        Mood and Affect: Mood is anxious and depressed. Affect is not tearful.        Speech: Speech normal.        Behavior: Behavior is cooperative.        Thought Content: Thought content normal. Thought content is not paranoid or delusional. Thought content does not include homicidal or suicidal ideation. Thought content does not include homicidal or suicidal plan.        Cognition and Memory: Cognition and memory normal.        Judgment: Judgment normal.     Comments: HYpoverbal. Insight ok     Lab Review:     Component Value Date/Time   NA 137 01/04/2017 1420   K 4.0 01/04/2017 1420   CL 98 01/04/2017 1420   CO2 23 01/04/2017 1420   GLUCOSE 86 01/04/2017 1420   GLUCOSE 76 07/08/2015 1440   BUN 6 01/04/2017 1420   CREATININE 0.71 01/04/2017 1420   CREATININE 0.66 07/08/2015 1440   CALCIUM 9.3 01/04/2017 1420   PROT 7.9 01/04/2017 1420   ALBUMIN 3.9 01/04/2017 1420   AST 22 01/04/2017 1420   ALT 26 01/04/2017 1420   ALKPHOS 99 01/04/2017 1420   BILITOT 0.2 01/04/2017 1420   GFRNONAA 111 01/04/2017 1420   GFRAA 128 01/04/2017 1420       Component Value Date/Time   WBC 7.7 01/04/2017 1420   WBC 6.4 07/08/2015 1440   RBC 4.48 01/04/2017 1420   RBC 4.38 07/08/2015 1440   HGB 14.3 01/04/2017 1420   HCT 40.9 01/04/2017 1420   PLT 363 01/04/2017 1420   MCV 91 01/04/2017 1420   MCH 31.9 01/04/2017 1420   MCH 32.4 07/08/2015 1440   MCHC 35.0 01/04/2017 1420    MCHC 34.4 07/08/2015 1440   RDW 14.3 01/04/2017 1420   LYMPHSABS 3.0 01/04/2017 1420   MONOABS 384 07/08/2015 1440   EOSABS 0.0 01/04/2017 1420   BASOSABS 0.0 01/04/2017 1420    No results found for: POCLITH,  LITHIUM   No results found for: PHENYTOIN, PHENOBARB, VALPROATE, CBMZ   .res Assessment: Plan:    Elaya was seen today for follow-up, depression and anxiety.  Diagnoses and all orders for this visit:  Severe bipolar I disorder, current or most recent episode depressed (HCC) -     QUEtiapine (SEROQUEL XR) 300 MG 24 hr tablet; Take 2 tablets (600 mg total) by mouth at bedtime.  Generalized anxiety disorder -     QUEtiapine (SEROQUEL XR) 300 MG 24 hr tablet; Take 2 tablets (600 mg total) by mouth at bedtime. -     PARoxetine (PAXIL) 20 MG tablet; Take 1.5 tablets (30 mg total) by mouth daily.  Panic disorder with agoraphobia -     PARoxetine (PAXIL) 20 MG tablet; Take 1.5 tablets (30 mg total) by mouth daily.  Insomnia due to mental condition -     QUEtiapine (SEROQUEL XR) 300 MG 24 hr tablet; Take 2 tablets (600 mg total) by mouth at bedtime.  Shift work sleep disorder -     Armodafinil 250 MG tablet; Take 1 tablet (250 mg total) by mouth daily.   Zoe Ryan had her diagnosis changed from major depression to bipolar depression in March.   Her depression and anxiety had been treatment resistant.  Her depression, panic and generalized anxiety disorder are all chronic.  As of her last appointment in May to this appointment anxiety has worsened again.    She is tolerating the current medications.  She is not having mood swings.  Work function is okay.   Option gabapentin.  The alternative of gabapentin would take a longer period of adjustment and is unlikely to have any potential to help with depressive symptoms.  The paroxetine is being used for panic disorder but there could be consideration to increase it in hopes of helping with depression as long as she is on an adequate mood  stabilizer.  There is risk of mood cycling with SSRIs and bipolar patients.  She has been able to reduce the daytime dose of Xanax XR substantially down to about 1 mg a day from 3 mg daily.  Discussed potential metabolic side effects associated with atypical antipsychotics, as well as potential risk for movement side effects. Advised pt to contact office if movement side effects occur.   For treatment resistant bipolar depression consider switch to [facility] though she has had a partial response with Vraylar.  Statistically these are the options remaining that have the best odds of helping her bipolar depression. Consider ECT. Off label option pramipexole for TRD.   Increase Seroquel XR to 600 mg daily for TR bipolar depression.  Disc SE.   Try to wean HS Xanax.   Call before July 1 if no improvement and we'll consider increasing the Seroquel XR to 800 mg daily.  Shift work managed with Nuvigil  FU 4 weeks  Meredith Staggers, MD, DFAPA   Please see After Visit Summary for patient specific instructions.  No future appointments.  No orders of the defined types were placed in this encounter.   No future appointments.  No orders of the defined types were placed in this encounter.     -------------------------------

## 2019-09-17 ENCOUNTER — Other Ambulatory Visit: Payer: Self-pay

## 2019-09-24 ENCOUNTER — Telehealth: Payer: Self-pay | Admitting: Psychiatry

## 2019-09-24 NOTE — Telephone Encounter (Signed)
Pt would like a early refill on her Xanax 0.5mg  and 1mg . Pt has been taking more than prescribed due to life changes and surgery. Please send to [facility].

## 2019-09-25 ENCOUNTER — Other Ambulatory Visit: Payer: Self-pay | Admitting: Psychiatry

## 2019-09-25 DIAGNOSIS — F4001 Agoraphobia with panic disorder: Secondary | ICD-10-CM

## 2019-09-25 DIAGNOSIS — F411 Generalized anxiety disorder: Secondary | ICD-10-CM

## 2019-09-25 DIAGNOSIS — F5105 Insomnia due to other mental disorder: Secondary | ICD-10-CM

## 2019-09-25 MED ORDER — ALPRAZOLAM 0.5 MG PO TABS: 0.5000 mg | ORAL_TABLET | Freq: Every day | ORAL | 0 refills | Status: DC

## 2019-09-25 MED ORDER — ALPRAZOLAM ER 1 MG PO TB24: 1.0000 mg | ORAL_TABLET | Freq: Three times a day (TID) | ORAL | 0 refills | Status: DC

## 2019-09-25 NOTE — Telephone Encounter (Signed)
She is already on a high dose of Xanax and exceeding the dosage Rx and running out a month early is entirely unacceptable and puts her at risk of seizures.  She is getting 2 different forms of Xanax.  PDMP shows: 07/24/2019  2   07/24/2019  Alprazolam 0.5 MG Tablet  90.00  90 Ca Cot   [id number]   [place] ([id number])   0/0  1.00 LME  Comm Ins   [place]  07/24/2019  2   07/24/2019  Alprazolam Xr 1 MG Tablet  270.00  90           If she cannot comply I will take her off the med.  To more closely monitor her compliance I will RX it to her 1 week at a time until we can establish her ability to comply. I sent in 1 week supply of each.

## 2019-09-25 NOTE — Telephone Encounter (Signed)
Zoe Ryan just called to check status of refill of her Xanax.  Yes she is out a month early.  She has been financially stressed and has a surgery coming up.  "It has just been too much".

## 2019-10-11 ENCOUNTER — Other Ambulatory Visit: Payer: Self-pay | Admitting: Psychiatry

## 2019-10-21 ENCOUNTER — Other Ambulatory Visit: Payer: Self-pay

## 2019-10-23 ENCOUNTER — Other Ambulatory Visit: Payer: Self-pay | Admitting: Psychiatry

## 2019-10-23 DIAGNOSIS — F411 Generalized anxiety disorder: Secondary | ICD-10-CM

## 2019-10-23 DIAGNOSIS — F5105 Insomnia due to other mental disorder: Secondary | ICD-10-CM

## 2019-10-23 DIAGNOSIS — F4001 Agoraphobia with panic disorder: Secondary | ICD-10-CM

## 2019-10-23 MED ORDER — ALPRAZOLAM ER 1 MG PO TB24: 1.0000 mg | ORAL_TABLET | Freq: Three times a day (TID) | ORAL | 0 refills | Status: DC

## 2019-10-23 MED ORDER — ALPRAZOLAM 0.5 MG PO TABS: 0.5000 mg | ORAL_TABLET | Freq: Every day | ORAL | 0 refills | Status: DC

## 2019-10-23 MED ORDER — ZALEPLON 10 MG PO CAPS: 10.0000 mg | ORAL_CAPSULE | Freq: Every day | ORAL | 0 refills | Status: DC

## 2019-10-23 NOTE — Telephone Encounter (Signed)
Pt would like a refill on Xanax 0.5mg  and Alprazolam 1mg . Please send to [facility].

## 2019-11-02 ENCOUNTER — Telehealth: Payer: Self-pay | Admitting: Psychiatry

## 2019-11-02 ENCOUNTER — Other Ambulatory Visit: Payer: Self-pay

## 2019-11-02 DIAGNOSIS — F314 Bipolar disorder, current episode depressed, severe, without psychotic features: Secondary | ICD-10-CM

## 2019-11-02 DIAGNOSIS — F5105 Insomnia due to other mental disorder: Secondary | ICD-10-CM

## 2019-11-02 DIAGNOSIS — F411 Generalized anxiety disorder: Secondary | ICD-10-CM

## 2019-11-02 MED ORDER — QUETIAPINE FUMARATE ER 300 MG PO TB24: 600.0000 mg | ORAL_TABLET | Freq: Every day | ORAL | 1 refills | Status: DC

## 2019-11-02 NOTE — Telephone Encounter (Signed)
Pt LM requesting refill on Seroquel. Contacted pharmacy no response. Apt 9/1

## 2019-11-02 NOTE — Telephone Encounter (Signed)
Rx sent 

## 2019-11-07 ENCOUNTER — Telehealth (INDEPENDENT_AMBULATORY_CARE_PROVIDER_SITE_OTHER): Payer: No Typology Code available for payment source | Admitting: Psychiatry

## 2019-11-07 ENCOUNTER — Encounter: Payer: Self-pay | Admitting: Psychiatry

## 2019-11-07 DIAGNOSIS — F5105 Insomnia due to other mental disorder: Secondary | ICD-10-CM

## 2019-11-07 DIAGNOSIS — F411 Generalized anxiety disorder: Secondary | ICD-10-CM | POA: Diagnosis not present

## 2019-11-07 DIAGNOSIS — F314 Bipolar disorder, current episode depressed, severe, without psychotic features: Secondary | ICD-10-CM

## 2019-11-07 DIAGNOSIS — F4001 Agoraphobia with panic disorder: Secondary | ICD-10-CM | POA: Diagnosis not present

## 2019-11-07 DIAGNOSIS — G4726 Circadian rhythm sleep disorder, shift work type: Secondary | ICD-10-CM

## 2019-11-07 MED ORDER — ALPRAZOLAM 0.5 MG PO TABS: 0.5000 mg | ORAL_TABLET | Freq: Every day | ORAL | 1 refills | Status: DC

## 2019-11-07 MED ORDER — ALPRAZOLAM ER 1 MG PO TB24: 1.0000 mg | ORAL_TABLET | Freq: Three times a day (TID) | ORAL | 0 refills | Status: DC

## 2019-11-07 MED ORDER — ARMODAFINIL 250 MG PO TABS: 250.0000 mg | ORAL_TABLET | Freq: Every day | ORAL | 0 refills | Status: DC

## 2019-11-07 MED ORDER — QUETIAPINE FUMARATE ER 400 MG PO TB24: 800.0000 mg | ORAL_TABLET | Freq: Every day | ORAL | 1 refills | Status: DC

## 2019-11-07 NOTE — Progress Notes (Signed)
Zoe Ryan [id number] 28-Dec-1980 39 y.o.   I connected with  Sybil Shrader on 11/07/19 by a video enabled telemedicine application and verified that I am speaking with the correct person using two identifiers.   I discussed the limitations of evaluation and management by telemedicine. The patient expressed understanding and agreed to proceed.    Subjective:   Patient ID:  Zoe Ryan is a 39 y.o. (DOB 07-27-1980) female.  Chief Complaint:  Chief Complaint  Patient presents with  . Follow-up    Medication Management  . Anxiety    Medication Management  . Other    Bipolar  . Depression    Anxiety Symptoms include nervous/anxious behavior. Patient reports no confusion, decreased concentration, dizziness or suicidal ideas.    Depression        Associated symptoms include no decreased concentration, no fatigue and no suicidal ideas.  Past medical history includes anxiety.      Zoe Ryan is seen again today urgently because of recent severe TR bipolar depressive and anxiety symptoms that have been interfering with her ability to work.  AT visit August 10, 2018 when for moderately severe anxiety she was instructed to increase sertraline  from 50 to 75 mg daily.  She's only been taking 50 sertraline bc forgot to increase it.  She was also continued on the recently increased Vraylar 6 mg daily.    At visit August 2020.  Patient was encouraged to increase sertraline from 50 to 75 mg again for panic and anxiety.  She had not done so previously as suggested.  She was not improved as of the last visit. Increased sertraline from 50 to 75 for anxiety and panic.  Continued Wellbutrin 450, Vraylar 6, Xanax XR 1 mg TID, Nuvigil 250, Sonata HS.   visit October 19 and the following changes were made: Increase sertraline further to 100 mg in hopes of further reducing generalized anxiety disorder and depression.  The sertraline has blocked of the panic attacks.  It made a little difference but not  that much.  Anxiety is worse than depression.   visit February 23, 2019.  She was taking sertraline 100 mg daily.  She felt the sertraline was causing some irritability.  We switched to paroxetine 20 mg.  Noticed a decrease in depression and anxiety and not as irritable with switch to paroxetine.  Tolerating well at HS. Daytime paroxetine dizzy.  Improvement plateau.  dep 4/10.  Anxiety 5/10.   Less work anxiety and a bit more satisfied overall.  Less tearful.   No mood swings.  Anxiety reduced to with work..  No recent panic.  A little more energy.  Still fairly Low motivation and interest.  Still Want to sleep and watch TV.  Stopped exercise bc motivation but trying to get better.  Poor productivity at home without change. 1 Missed days work DT depression.  Functioning well at work. Intermittent FMLA .  Can struggle to maintain job interest but may be passing.  Been there 10 years.  No mood swings.  Can enjoy things. Good sleep.  Normally 8-9 hours sleep.  On weekend can sleep 10 hours then nap.  seen April 06, 2019.  The following change was made:  Increase paroxetine to 30 mg daily to further reduce anxiety and potentially depression .  She remained on Vraylar 6 mg, Wellbutrin XL 450,  Nuvigil 250 mg, Xanax XR 1 mg 3 times daily. She called back March 11 complaining of feeling numb all the time as  well as depression and weight gain.  It was affecting work.  We will put her on the cancellation list to try to get an appointment as soon as possible as there was not an obvious med adjustment that could be made outside of an appointment. She cut back the paroxetine to 20 mg bc gain 15#, feeling numb, and trouble concentrating about March 11.   07/11/2019 appointment with the following noted: Doing better overall with SE paroxetine.  A little more focus.  Feeling less numb.  Dep 5/10.  Anxiety 6/10.  Not much progress over the last couple of mos.  Still low interest, enjoyment, motivation.  Missed 1 day in  last month but running late and leaving early.  No panic.  Thinks paroxetine helps anxiety some.  Sticking with 3 daily of Xanax XR 1 mg.  Xanax 0.5 about 7 times per week for sleep. Sleep good with more disciplined schedule helped. 8 hours.  If can will sleep a lot.  No NM. Plan:Increase Seroquel XR to 300 mg daily for TR bipolar depression.  Disc SE.   Try to wean HS Xanax.  08/16/2019 appointment with the following noted: No additional improvement with increase Seroquel but no addional SE either. Dep & anxiety 6-7/10 worse than last time. Work function reduced and had to leave early a couple of days.  Couple of panic before work. Sleep variable from normal to excessive.  Started a new diet with restrictive calories and reinjured knee only sig stressors. Plan: For treatment resistant bipolar depression [facility] though she has had a partial response with Vraylar.  Statistically these are the options remaining that have the best odds of helping her bipolar depression. Consider ECT. Off label option pramipexole for TRD.  Increase Seroquel XR to 600 mg daily for TR bipolar depression.  Disc SE.   Try to wean HS Xanax.  Call before July 1 if no improvement and we'll consider increasing the Seroquel XR to 800 mg daily.  09/24/2019 phone call with patient requesting early refill of Xanax saying she had been very stressed out and had taken more than prescribed MD response:  She is already on a high dose of Xanax and exceeding the dosage Rx and running out a month early is entirely unacceptable and puts her at risk of seizures.  She is getting 2 different forms of Xanax.  PDMP shows: 07/24/2019  2   07/24/2019  Alprazolam 0.5 MG Tablet  90.00  90 Ca Cot   [id number]   Nor ([id number])   0/0  1.00 LME  Comm Ins   [place]  07/24/2019  2   07/24/2019  Alprazolam Xr 1 MG Tablet  270.00  90           If she cannot comply I will take her off the med.  To more closely monitor her compliance I will RX  it to her 1 week at a time until we can establish her ability to comply. I sent in 1 week supply of each.  11/07/2019 appointment with the following noted: Never ran out of Xanax but feared it.  Was highly anxious anticipating surgery 3-4 weeks ago.  Went well  No complications and getting back to normal work now.  At work last week. Increase Seroquel XR to 600 mg nightly.  Was in a lot of pain at the time and hard to say the effect.   Depression 4/10, anxiety 6/10.  Sleeping well.  8 hours.  More on  weekends.  No SE. Has panic at least 2 / week and usually triggered by work or social unfamiliar places.   No mania nor mood swings or agitation.  Work is best function in a long time.  In March 2020 her diagnosis was changed to bipolar depression.    Failed psychiatric medications include sertraline 100, Lexapro, paroxetine 30, fluoxetine, venlafaxine, Wellbutrin 450, duloxetine 120  , sertraline 100 more irritable,  Failed to respond to lexapro plus Rexulti lithium,  lamotrigine doesn't remember why she stopped, valproic acid, Abilify, lithium, Rexulti,  She says the propranolol is not very helpful for anxiety, but taken it once or twice for tremor with anxiety. Had some mood improvement in stability with increase Vraylar to 6. Seroquel XR 600 mg daily. Ambien amnesia  Review of Systems:  Review of Systems  Constitutional: Negative for fatigue.  Gastrointestinal: Negative for diarrhea.  Genitourinary:       Stress diarrhea resolved    Musculoskeletal: Positive for arthralgias.  Neurological: Negative for dizziness, tremors and weakness.  Psychiatric/Behavioral: Positive for depression and dysphoric mood. Negative for agitation, behavioral problems, confusion, decreased concentration, hallucinations, self-injury, sleep disturbance and suicidal ideas. The patient is nervous/anxious. The patient is not hyperactive.     Medications: I have reviewed the patient's current  medications.  Current Outpatient Medications  Medication Sig Dispense Refill  . ALPRAZolam (ALPRAZOLAM XR) 1 MG 24 hr tablet Take 1 tablet (1 mg total) by mouth 3 (three) times daily. 90 tablet 0  . ALPRAZolam (XANAX) 0.5 MG tablet Take 1 tablet (0.5 mg total) by mouth at bedtime. 30 tablet 1  . Armodafinil 250 MG tablet Take 1 tablet (250 mg total) by mouth daily. 90 tablet 0  . etonogestrel (NEXPLANON) 68 MG IMPL implant 1 each by Subdermal route once.    . Multiple Vitamin (MULITIVITAMIN WITH MINERALS) TABS Take 1 tablet by mouth daily.      Marland Kitchen PARoxetine (PAXIL) 20 MG tablet Take 1.5 tablets (30 mg total) by mouth daily. 135 tablet 0  . QUEtiapine (SEROQUEL XR) 400 MG 24 hr tablet Take 2 tablets (800 mg total) by mouth at bedtime. 60 tablet 1  . zaleplon (SONATA) 10 MG capsule Take 1 capsule (10 mg total) by mouth at bedtime. 30 capsule 0   No current facility-administered medications for this visit.    Medication Side Effects: None  Allergies: No Known Allergies  Past Medical History:  Diagnosis Date  . Anxiety   . Chronic shoulder pain   . Depression   . History of tobacco use    Quit 2013    Family History  Problem Relation Age of Onset  . Anxiety disorder Maternal Grandmother   . Mental retardation Maternal Grandmother   . Congestive Heart Failure Maternal Grandfather   . Cancer Maternal Grandfather        lymphoma  . Stroke Paternal Grandfather   . Mental illness Brother        depression  . Hyperlipidemia Brother     Social History   Socioeconomic History  . Marital status: Married    Spouse name: [name]  . Number of children: 0  . Years of education: College  . Highest education level: Not on file  Occupational History  . Occupation: Building control surveyor: [employer]  Tobacco Use  . Smoking status: Former Games developer  . Smokeless tobacco: Never Used  Vaping Use  . Vaping Use: Every day  Substance and Sexual Activity  . Alcohol use:  Yes     Alcohol/week: 11.0 standard drinks    Types: 11 Cans of beer per week  . Drug use: No  . Sexual activity: Never    Partners: Male    Comment: NuvaRing  Other Topics Concern  . Not on file  Social History Narrative   Lives with her husband.     Education: [employer]   Exercise: Yes   Social Determinants of Health   Financial Resource Strain:   . Difficulty of Paying Living Expenses: Not on file  Food Insecurity:   . Worried About Programme researcher, broadcasting/film/video in the Last Year: Not on file  . Ran Out of Food in the Last Year: Not on file  Transportation Needs:   . Lack of Transportation (Medical): Not on file  . Lack of Transportation (Non-Medical): Not on file  Physical Activity:   . Days of Exercise per Week: Not on file  . Minutes of Exercise per Session: Not on file  Stress:   . Feeling of Stress : Not on file  Social Connections:   . Frequency of Communication with Friends and Family: Not on file  . Frequency of Social Gatherings with Friends and Family: Not on file  . Attends Religious Services: Not on file  . Active Member of Clubs or Organizations: Not on file  . Attends Banker Meetings: Not on file  . Marital Status: Not on file  Intimate Partner Violence:   . Fear of Current or Ex-Partner: Not on file  . Emotionally Abused: Not on file  . Physically Abused: Not on file  . Sexually Abused: Not on file    Past Medical History, Surgical history, Social history, and Family history were reviewed and updated as appropriate.   Please see review of systems for further details on the patient's review from today.   Objective:   Physical Exam:  There were no vitals taken for this visit.  Physical Exam Neurological:     Mental Status: She is alert and oriented to person, place, and time.     Cranial Nerves: No dysarthria.  Psychiatric:        Attention and Perception: Attention normal.        Mood and Affect: Mood is anxious and depressed. Affect is not tearful.         Speech: Speech normal.        Behavior: Behavior is cooperative.        Thought Content: Thought content normal. Thought content is not paranoid or delusional. Thought content does not include homicidal or suicidal ideation. Thought content does not include homicidal or suicidal plan.        Cognition and Memory: Cognition and memory normal.        Judgment: Judgment normal.     Comments: Less HYpoverbal. Insight ok Less depressed than last time.      Review of Systems:  Review of Systems  Constitutional: Negative for fatigue.  Gastrointestinal: Negative for diarrhea.  Genitourinary:       Stress diarrhea resolved    Neurological: Negative for dizziness, tremors and weakness.  Psychiatric/Behavioral: Positive for depression and dysphoric mood. Negative for agitation, behavioral problems, confusion, decreased concentration, hallucinations, self-injury, sleep disturbance and suicidal ideas. The patient is nervous/anxious. The patient is not hyperactive.     Medications: I have reviewed the patient's current medications.  Current Outpatient Medications  Medication Sig Dispense Refill  . ALPRAZolam (ALPRAZOLAM XR) 1 MG 24 hr tablet Take 1 tablet (1  mg total) by mouth 3 (three) times daily. 90 tablet 0  . ALPRAZolam (XANAX) 0.5 MG tablet Take 1 tablet (0.5 mg total) by mouth at bedtime. 30 tablet 1  . Armodafinil 250 MG tablet Take 1 tablet (250 mg total) by mouth daily. 90 tablet 0  . etonogestrel (NEXPLANON) 68 MG IMPL implant 1 each by Subdermal route once.    . Multiple Vitamin (MULITIVITAMIN WITH MINERALS) TABS Take 1 tablet by mouth daily.      Marland Kitchen PARoxetine (PAXIL) 20 MG tablet Take 1.5 tablets (30 mg total) by mouth daily. 135 tablet 0  . QUEtiapine (SEROQUEL XR) 400 MG 24 hr tablet Take 2 tablets (800 mg total) by mouth at bedtime. 60 tablet 1  . zaleplon (SONATA) 10 MG capsule Take 1 capsule (10 mg total) by mouth at bedtime. 30 capsule 0   No current  facility-administered medications for this visit.    Medication Side Effects: dry mouth  Allergies: No Known Allergies  Past Medical History:  Diagnosis Date  . Anxiety   . Chronic shoulder pain   . Depression   . History of tobacco use    Quit 2013    Family History  Problem Relation Age of Onset  . Anxiety disorder Maternal Grandmother   . Mental retardation Maternal Grandmother   . Congestive Heart Failure Maternal Grandfather   . Cancer Maternal Grandfather        lymphoma  . Stroke Paternal Grandfather   . Mental illness Brother        depression  . Hyperlipidemia Brother     Social History   Socioeconomic History  . Marital status: Married    Spouse name: [name]  . Number of children: 0  . Years of education: College  . Highest education level: Not on file  Occupational History  . Occupation: Building control surveyor: [employer]  Tobacco Use  . Smoking status: Former Games developer  . Smokeless tobacco: Never Used  Vaping Use  . Vaping Use: Every day  Substance and Sexual Activity  . Alcohol use: Yes    Alcohol/week: 11.0 standard drinks    Types: 11 Cans of beer per week  . Drug use: No  . Sexual activity: Never    Partners: Male    Comment: NuvaRing  Other Topics Concern  . Not on file  Social History Narrative   Lives with her husband.     Education: [employer]   Exercise: Yes   Social Determinants of Health   Financial Resource Strain:   . Difficulty of Paying Living Expenses: Not on file  Food Insecurity:   . Worried About Programme researcher, broadcasting/film/video in the Last Year: Not on file  . Ran Out of Food in the Last Year: Not on file  Transportation Needs:   . Lack of Transportation (Medical): Not on file  . Lack of Transportation (Non-Medical): Not on file  Physical Activity:   . Days of Exercise per Week: Not on file  . Minutes of Exercise per Session: Not on file  Stress:   . Feeling of Stress : Not on file  Social Connections:   . Frequency of  Communication with Friends and Family: Not on file  . Frequency of Social Gatherings with Friends and Family: Not on file  . Attends Religious Services: Not on file  . Active Member of Clubs or Organizations: Not on file  . Attends Banker Meetings: Not on file  . Marital Status: Not  on file  Intimate Partner Violence:   . Fear of Current or Ex-Partner: Not on file  . Emotionally Abused: Not on file  . Physically Abused: Not on file  . Sexually Abused: Not on file    Past Medical History, Surgical history, Social history, and Family history were reviewed and updated as appropriate.   Please see review of systems for further details on the patient's review from today.   Objective:   Physical Exam:  There were no vitals taken for this visit.  Physical Exam Neurological:     Mental Status: She is alert and oriented to person, place, and time.     Cranial Nerves: No dysarthria.  Psychiatric:        Attention and Perception: Attention normal.        Mood and Affect: Mood is anxious and depressed. Affect is not tearful.        Speech: Speech normal.        Behavior: Behavior is cooperative.        Thought Content: Thought content normal. Thought content is not paranoid or delusional. Thought content does not include homicidal or suicidal ideation. Thought content does not include homicidal or suicidal plan.        Cognition and Memory: Cognition and memory normal.        Judgment: Judgment normal.     Comments: HYpoverbal. Insight ok     Lab Review:     Component Value Date/Time   NA 137 01/04/2017 1420   K 4.0 01/04/2017 1420   CL 98 01/04/2017 1420   CO2 23 01/04/2017 1420   GLUCOSE 86 01/04/2017 1420   GLUCOSE 76 07/08/2015 1440   BUN 6 01/04/2017 1420   CREATININE 0.71 01/04/2017 1420   CREATININE 0.66 07/08/2015 1440   CALCIUM 9.3 01/04/2017 1420   PROT 7.9 01/04/2017 1420   ALBUMIN 3.9 01/04/2017 1420   AST 22 01/04/2017 1420   ALT 26 01/04/2017  1420   ALKPHOS 99 01/04/2017 1420   BILITOT 0.2 01/04/2017 1420   GFRNONAA 111 01/04/2017 1420   GFRAA 128 01/04/2017 1420       Component Value Date/Time   WBC 7.7 01/04/2017 1420   WBC 6.4 07/08/2015 1440   RBC 4.48 01/04/2017 1420   RBC 4.38 07/08/2015 1440   HGB 14.3 01/04/2017 1420   HCT 40.9 01/04/2017 1420   PLT 363 01/04/2017 1420   MCV 91 01/04/2017 1420   MCH 31.9 01/04/2017 1420   MCH 32.4 07/08/2015 1440   MCHC 35.0 01/04/2017 1420   MCHC 34.4 07/08/2015 1440   RDW 14.3 01/04/2017 1420   LYMPHSABS 3.0 01/04/2017 1420   MONOABS 384 07/08/2015 1440   EOSABS 0.0 01/04/2017 1420   BASOSABS 0.0 01/04/2017 1420    No results found for: POCLITH, LITHIUM   No results found for: PHENYTOIN, PHENOBARB, VALPROATE, CBMZ   .res Assessment: Plan:    Zoe Ryan was seen today for follow-up, anxiety, other and depression.  Diagnoses and all orders for this visit:  Severe bipolar I disorder, current or most recent episode depressed (HCC) -     QUEtiapine (SEROQUEL XR) 400 MG 24 hr tablet; Take 2 tablets (800 mg total) by mouth at bedtime.  Generalized anxiety disorder -     QUEtiapine (SEROQUEL XR) 400 MG 24 hr tablet; Take 2 tablets (800 mg total) by mouth at bedtime. -     ALPRAZolam (ALPRAZOLAM XR) 1 MG 24 hr tablet; Take 1 tablet (1 mg total) by  mouth 3 (three) times daily.  Panic disorder with agoraphobia -     ALPRAZolam (ALPRAZOLAM XR) 1 MG 24 hr tablet; Take 1 tablet (1 mg total) by mouth 3 (three) times daily.  Insomnia due to mental condition -     QUEtiapine (SEROQUEL XR) 400 MG 24 hr tablet; Take 2 tablets (800 mg total) by mouth at bedtime. -     ALPRAZolam (XANAX) 0.5 MG tablet; Take 1 tablet (0.5 mg total) by mouth at bedtime.  Shift work sleep disorder -     Armodafinil 250 MG tablet; Take 1 tablet (250 mg total) by mouth daily.   Zoe Ryan had her diagnosis changed from major depression to bipolar depression in March.   Her depression and anxiety had been  treatment resistant.  Her depression, panic and generalized anxiety disorder are all chronic.  As of her last appointment in May to this appointment anxiety has worsened again.    She is tolerating the current medications.  She is not having mood swings.  Work function is okay.   Option gabapentin.  The alternative of gabapentin would take a longer period of adjustment and is unlikely to have any potential to help with depressive symptoms.  The paroxetine is being used for panic disorder but there could be consideration to increase it in hopes of helping with depression as long as she is on an adequate mood stabilizer.  There is risk of mood cycling with SSRIs and bipolar patients.  She has been able to reduce the daytime dose of Xanax XR substantially down to about 1 mg a day from 3 mg daily.  Discussed potential metabolic side effects associated with atypical antipsychotics, as well as potential risk for movement side effects. Advised pt to contact office if movement side effects occur.   For treatment resistant bipolar depression consider switch to [facility] though she has had a partial response with Vraylar.  Statistically these are the options remaining that have the best odds of helping her bipolar depression. Consider ECT. Off label option pramipexole for TRD.   Increase Seroquel XR to 800 mg daily for TR bipolar depression and reduction in anxiety.  Disc SE.    Increase paroxetine 40 mg daily for panic and anxiety bc poor control  Shift work managed with Nuvigil  FU 4 weeks  Meredith Staggers, MD, DFAPA   Please see After Visit Summary for patient specific instructions.  No future appointments.  No orders of the defined types were placed in this encounter.   No future appointments.  No orders of the defined types were placed in this encounter.     -------------------------------

## 2019-11-15 ENCOUNTER — Other Ambulatory Visit: Payer: Self-pay | Admitting: Psychiatry

## 2019-11-16 NOTE — Telephone Encounter (Signed)
Due back 11/01

## 2019-12-07 ENCOUNTER — Other Ambulatory Visit: Payer: Self-pay

## 2019-12-07 DIAGNOSIS — F411 Generalized anxiety disorder: Secondary | ICD-10-CM

## 2019-12-07 DIAGNOSIS — F4001 Agoraphobia with panic disorder: Secondary | ICD-10-CM

## 2019-12-07 MED ORDER — PAROXETINE HCL 20 MG PO TABS: 30.0000 mg | ORAL_TABLET | Freq: Every day | ORAL | 0 refills | Status: DC

## 2019-12-13 ENCOUNTER — Other Ambulatory Visit: Payer: Self-pay | Admitting: Psychiatry

## 2019-12-13 DIAGNOSIS — F4001 Agoraphobia with panic disorder: Secondary | ICD-10-CM

## 2019-12-13 DIAGNOSIS — F411 Generalized anxiety disorder: Secondary | ICD-10-CM

## 2019-12-14 NOTE — Telephone Encounter (Signed)
Please send

## 2020-01-07 ENCOUNTER — Telehealth (INDEPENDENT_AMBULATORY_CARE_PROVIDER_SITE_OTHER): Payer: No Typology Code available for payment source | Admitting: Psychiatry

## 2020-01-07 ENCOUNTER — Encounter: Payer: Self-pay | Admitting: Psychiatry

## 2020-01-07 DIAGNOSIS — F5105 Insomnia due to other mental disorder: Secondary | ICD-10-CM | POA: Diagnosis not present

## 2020-01-07 DIAGNOSIS — F4001 Agoraphobia with panic disorder: Secondary | ICD-10-CM | POA: Diagnosis not present

## 2020-01-07 DIAGNOSIS — F314 Bipolar disorder, current episode depressed, severe, without psychotic features: Secondary | ICD-10-CM

## 2020-01-07 DIAGNOSIS — F411 Generalized anxiety disorder: Secondary | ICD-10-CM

## 2020-01-07 DIAGNOSIS — G4726 Circadian rhythm sleep disorder, shift work type: Secondary | ICD-10-CM

## 2020-01-07 MED ORDER — QUETIAPINE FUMARATE ER 400 MG PO TB24: 1200.0000 mg | ORAL_TABLET | Freq: Every day | ORAL | 1 refills | Status: DC

## 2020-01-07 NOTE — Progress Notes (Signed)
Zoe Ryan [id number] [birth year] 39 y.o.   I connected with  Sargun Rummell on 01/07/20 by a video enabled telemedicine application and verified that I am speaking with the correct person using two identifiers.   I discussed the limitations of evaluation and management by telemedicine. The patient expressed understanding and agreed to proceed.    Subjective:   Patient ID:  Zoe Ryan is a 39 y.o. (DOB December 07, 1980) female.  Chief Complaint:  Chief Complaint  Patient presents with  . Follow-up  . Anxiety  . Depression  . Panic Attack    Anxiety Symptoms include nervous/anxious behavior. Patient reports no confusion, decreased concentration, dizziness or suicidal ideas.    Depression        Associated symptoms include no decreased concentration, no fatigue and no suicidal ideas.  Past medical history includes anxiety.      Zoe Ryan is seen again today urgently because of recent severe TR bipolar depressive and anxiety symptoms that have been interfering with her ability to work.  AT visit August 10, 2018 when for moderately severe anxiety she was instructed to increase sertraline  from 50 to 75 mg daily.  She's only been taking 50 sertraline bc forgot to increase it.  She was also continued on the recently increased Vraylar 6 mg daily.    At visit August 2020.  Patient was encouraged to increase sertraline from 50 to 75 mg again for panic and anxiety.  She had not done so previously as suggested.  She was not improved as of the last visit. Increased sertraline from 50 to 75 for anxiety and panic.  Continued Wellbutrin 450, Vraylar 6, Xanax XR 1 mg TID, Nuvigil 250, Sonata HS.   visit October 19 and the following changes were made: Increase sertraline further to 100 mg in hopes of further reducing generalized anxiety disorder and depression.  The sertraline has blocked of the panic attacks.  It made a little difference but not that much.  Anxiety is worse than depression.   visit  February 23, 2019.  She was taking sertraline 100 mg daily.  She felt the sertraline was causing some irritability.  We switched to paroxetine 20 mg.  Noticed a decrease in depression and anxiety and not as irritable with switch to paroxetine.  Tolerating well at HS. Daytime paroxetine dizzy.  Improvement plateau.  dep 4/10.  Anxiety 5/10.   Less work anxiety and a bit more satisfied overall.  Less tearful.   No mood swings.  Anxiety reduced to with work..  No recent panic.  A little more energy.  Still fairly Low motivation and interest.  Still Want to sleep and watch TV.  Stopped exercise bc motivation but trying to get better.  Poor productivity at home without change. 1 Missed days work DT depression.  Functioning well at work. Intermittent FMLA .  Can struggle to maintain job interest but may be passing.  Been there 10 years.  No mood swings.  Can enjoy things. Good sleep.  Normally 8-9 hours sleep.  On weekend can sleep 10 hours then nap.  seen April 06, 2019.  The following change was made:  Increase paroxetine to 30 mg daily to further reduce anxiety and potentially depression .  She remained on Vraylar 6 mg, Wellbutrin XL 450,  Nuvigil 250 mg, Xanax XR 1 mg 3 times daily. She called back March 11 complaining of feeling numb all the time as well as depression and weight gain.  It was affecting work.  We  will put her on the cancellation list to try to get an appointment as soon as possible as there was not an obvious med adjustment that could be made outside of an appointment. She cut back the paroxetine to 20 mg bc gain 15#, feeling numb, and trouble concentrating about March 11.   07/11/2019 appointment with the following noted: Doing better overall with SE paroxetine.  A little more focus.  Feeling less numb.  Dep 5/10.  Anxiety 6/10.  Not much progress over the last couple of mos.  Still low interest, enjoyment, motivation.  Missed 1 day in last month but running late and leaving early.  No panic.   Thinks paroxetine helps anxiety some.  Sticking with 3 daily of Xanax XR 1 mg.  Xanax 0.5 about 7 times per week for sleep. Sleep good with more disciplined schedule helped. 8 hours.  If can will sleep a lot.  No NM. Plan:Increase Seroquel XR to 300 mg daily for TR bipolar depression.  Disc SE.   Try to wean HS Xanax.  08/16/2019 appointment with the following noted: No additional improvement with increase Seroquel but no addional SE either. Dep & anxiety 6-7/10 worse than last time. Work function reduced and had to leave early a couple of days.  Couple of panic before work. Sleep variable from normal to excessive.  Started a new diet with restrictive calories and reinjured knee only sig stressors. Plan: For treatment resistant bipolar depression consider switch to [facility] though she has had a partial response with Vraylar.  Statistically these are the options remaining that have the best odds of helping her bipolar depression. Consider ECT. Off label option pramipexole for TRD.  Increase Seroquel XR to 600 mg daily for TR bipolar depression.  Disc SE.   Try to wean HS Xanax.  Call before July 1 if no improvement and we'll consider increasing the Seroquel XR to 800 mg daily.  09/24/2019 phone call with patient requesting early refill of Xanax saying she had been very stressed out and had taken more than prescribed MD response:  She is already on a high dose of Xanax and exceeding the dosage Rx and running out a month early is entirely unacceptable and puts her at risk of seizures.  She is getting 2 different forms of Xanax.  PDMP shows: 07/24/2019  2   07/24/2019  Alprazolam 0.5 MG Tablet  90.00  90 Ca Cot   [id number]   Nor ([id number])   0/0  1.00 LME  Comm Ins   [place]  07/24/2019  2   07/24/2019  Alprazolam Xr 1 MG Tablet  270.00  90           If she cannot comply I will take her off the med.  To more closely monitor her compliance I will RX it to her 1 week at a time until we can establish her  ability to comply. I sent in 1 week supply of each.  11/07/2019 appointment with the following noted: Never ran out of Xanax but feared it.  Was highly anxious anticipating surgery 3-4 weeks ago.  Went well  No complications and getting back to normal work now.  At work last week. Increase Seroquel XR to 600 mg nightly.  Was in a lot of pain at the time and hard to say the effect.   Depression 4/10, anxiety 6/10.  Sleeping well.  8 hours.  More on weekends.  No SE. Has panic at least 2 / week and usually  triggered by work or Mining engineer places.   No mania nor mood swings or agitation.  Work is best function in a long time. Plan: Increase Seroquel XR to 800 mg daily for TR bipolar depression and reduction in anxiety.  Disc SE.   Increase paroxetine 40 mg daily for panic and anxiety bc poor control  01/07/2020 appointment with the following noted: Had 2-3 happy days but otherwise not much change.  Was quite sick last week which was horrible.Zoe Ryan about dx criteria for PTSD.  Had a rough time 15 years ago without meds.  Lived in house with friends and it ballooned into weird gaslighting of people doing things behind her back.  Was depressed and self medicating with alcohol.  If get this slight feeling or rejection then all the old hurt comes back.  This might be why haven't made new friends since that incident.  Sounds benign but they really messed with my head.  Made me sound so paranoid and delusional which reinforced their idea she wasn't welcome anymore.  Still getting triggered by silly stuff like that.  Anything that reminds me of that makes it hard to function.  No SE.  Sleeping long bc recently sick and before that was 8-10 hours. 1 panic since here on Monday morning. depression 6/10 and anxiety 4/10. Occ crying spells.  Sometimes interest.   Gained weight over time since broken leg and change in job or marriage.  In weight management program now.   In March 2020 her diagnosis was  changed to bipolar depression.    Failed psychiatric medications include sertraline 100, Lexapro, paroxetine 30, fluoxetine, venlafaxine, Wellbutrin 450, duloxetine 120  , sertraline 100 more irritable,  Failed to respond to lexapro plus Rexulti lithium,  lamotrigine doesn't remember why she stopped, valproic acid, Abilify, lithium, Rexulti,  She says the propranolol is not very helpful for anxiety, but taken it once or twice for tremor with anxiety. Had some mood improvement in stability with increase Vraylar to 6. Seroquel XR 600 mg daily. Ambien amnesia  Review of Systems:  Review of Systems  Constitutional: Negative for fatigue.  Gastrointestinal: Negative for diarrhea.  Genitourinary:       Stress diarrhea resolved    Musculoskeletal: Positive for arthralgias.  Neurological: Negative for dizziness, tremors and weakness.  Psychiatric/Behavioral: Positive for depression and dysphoric mood. Negative for agitation, behavioral problems, confusion, decreased concentration, hallucinations, self-injury, sleep disturbance and suicidal ideas. The patient is nervous/anxious. The patient is not hyperactive.     Medications: I have reviewed the patient's current medications.  Current Outpatient Medications  Medication Sig Dispense Refill  . ALPRAZolam (XANAX) 0.5 MG tablet Take 1 tablet (0.5 mg total) by mouth at bedtime. 30 tablet 1  . ALPRAZOLAM XR 1 MG 24 hr tablet Take 1 tablet (1 mg total) by mouth 3 (three) times daily. 90 tablet 0  . Armodafinil 250 MG tablet Take 1 tablet (250 mg total) by mouth daily. 90 tablet 0  . etonogestrel (NEXPLANON) 68 MG IMPL implant 1 each by Subdermal route once.    . Multiple Vitamin (MULITIVITAMIN WITH MINERALS) TABS Take 1 tablet by mouth daily.      Marland Kitchen PARoxetine (PAXIL) 20 MG tablet Take 1.5 tablets (30 mg total) by mouth daily. (Patient taking differently: Take 40 mg by mouth daily. ) 135 tablet 0  . QUEtiapine (SEROQUEL XR) 400 MG 24 hr tablet Take  2 tablets (800 mg total) by mouth at bedtime. 60 tablet 1  . zaleplon (  SONATA) 10 MG capsule Take 1 capsule (10 mg total) by mouth at bedtime. 30 capsule 1   No current facility-administered medications for this visit.    Medication Side Effects: None  Allergies: No Known Allergies  Past Medical History:  Diagnosis Date  . Anxiety   . Chronic shoulder pain   . Depression   . History of tobacco use    Quit 2013    Family History  Problem Relation Age of Onset  . Anxiety disorder Maternal Grandmother   . Mental retardation Maternal Grandmother   . Congestive Heart Failure Maternal Grandfather   . Cancer Maternal Grandfather        lymphoma  . Stroke Paternal Grandfather   . Mental illness Brother        depression  . Hyperlipidemia Brother     Social History   Socioeconomic History  . Marital status: Married    Spouse name: Ronaldo MiyamotoKyle  . Number of children: 0  . Years of education: College  . Highest education level: Not on file  Occupational History  . Occupation: Building control surveyorlab tech    Employer: [employer]  Tobacco Use  . Smoking status: Former Games developermoker  . Smokeless tobacco: Never Used  Vaping Use  . Vaping Use: Every day  Substance and Sexual Activity  . Alcohol use: Yes    Alcohol/week: 11.0 standard drinks    Types: 11 Cans of beer per week  . Drug use: No  . Sexual activity: Never    Partners: Male    Comment: NuvaRing  Other Topics Concern  . Not on file  Social History Narrative   Lives with her husband.     Education: [employer]   Exercise: Yes   Social Determinants of Health   Financial Resource Strain:   . Difficulty of Paying Living Expenses: Not on file  Food Insecurity:   . Worried About Programme researcher, broadcasting/film/videounning Out of Food in the Last Year: Not on file  . Ran Out of Food in the Last Year: Not on file  Transportation Needs:   . Lack of Transportation (Medical): Not on file  . Lack of Transportation (Non-Medical): Not on file  Physical Activity:   . Days of  Exercise per Week: Not on file  . Minutes of Exercise per Session: Not on file  Stress:   . Feeling of Stress : Not on file  Social Connections:   . Frequency of Communication with Friends and Family: Not on file  . Frequency of Social Gatherings with Friends and Family: Not on file  . Attends Religious Services: Not on file  . Active Member of Clubs or Organizations: Not on file  . Attends BankerClub or Organization Meetings: Not on file  . Marital Status: Not on file  Intimate Partner Violence:   . Fear of Current or Ex-Partner: Not on file  . Emotionally Abused: Not on file  . Physically Abused: Not on file  . Sexually Abused: Not on file    Past Medical History, Surgical history, Social history, and Family history were reviewed and updated as appropriate.   Please see review of systems for further details on the patient's review from today.   Objective:   Physical Exam:  There were no vitals taken for this visit.  Physical Exam Neurological:     Mental Status: She is alert and oriented to person, place, and time.     Cranial Nerves: No dysarthria.  Psychiatric:        Attention and Perception: Attention  normal.        Mood and Affect: Mood is anxious and depressed. Affect is not tearful.        Speech: Speech normal.        Behavior: Behavior is cooperative.        Thought Content: Thought content normal. Thought content is not paranoid or delusional. Thought content does not include homicidal or suicidal ideation. Thought content does not include homicidal or suicidal plan.        Cognition and Memory: Cognition and memory normal.        Judgment: Judgment normal.     Comments: Less HYpoverbal. Insight ok Less depressed than last time.      Review of Systems:  Review of Systems  Constitutional: Negative for fatigue.  Gastrointestinal: Negative for diarrhea.  Genitourinary:       Stress diarrhea resolved    Neurological: Negative for dizziness, tremors and  weakness.  Psychiatric/Behavioral: Positive for depression and dysphoric mood. Negative for agitation, behavioral problems, confusion, decreased concentration, hallucinations, self-injury, sleep disturbance and suicidal ideas. The patient is nervous/anxious. The patient is not hyperactive.     Medications: I have reviewed the patient's current medications.  Current Outpatient Medications  Medication Sig Dispense Refill  . ALPRAZolam (XANAX) 0.5 MG tablet Take 1 tablet (0.5 mg total) by mouth at bedtime. 30 tablet 1  . ALPRAZOLAM XR 1 MG 24 hr tablet Take 1 tablet (1 mg total) by mouth 3 (three) times daily. 90 tablet 0  . Armodafinil 250 MG tablet Take 1 tablet (250 mg total) by mouth daily. 90 tablet 0  . etonogestrel (NEXPLANON) 68 MG IMPL implant 1 each by Subdermal route once.    . Multiple Vitamin (MULITIVITAMIN WITH MINERALS) TABS Take 1 tablet by mouth daily.      Marland Kitchen PARoxetine (PAXIL) 20 MG tablet Take 1.5 tablets (30 mg total) by mouth daily. (Patient taking differently: Take 40 mg by mouth daily. ) 135 tablet 0  . QUEtiapine (SEROQUEL XR) 400 MG 24 hr tablet Take 2 tablets (800 mg total) by mouth at bedtime. 60 tablet 1  . zaleplon (SONATA) 10 MG capsule Take 1 capsule (10 mg total) by mouth at bedtime. 30 capsule 1   No current facility-administered medications for this visit.    Medication Side Effects: dry mouth  Allergies: No Known Allergies  Past Medical History:  Diagnosis Date  . Anxiety   . Chronic shoulder pain   . Depression   . History of tobacco use    Quit 2013    Family History  Problem Relation Age of Onset  . Anxiety disorder Maternal Grandmother   . Mental retardation Maternal Grandmother   . Congestive Heart Failure Maternal Grandfather   . Cancer Maternal Grandfather        lymphoma  . Stroke Paternal Grandfather   . Mental illness Brother        depression  . Hyperlipidemia Brother     Social History   Socioeconomic History  . Marital  status: Married    Spouse name: [name]  . Number of children: 0  . Years of education: College  . Highest education level: Not on file  Occupational History  . Occupation: Building control surveyor: [employer]  Tobacco Use  . Smoking status: Former Games developer  . Smokeless tobacco: Never Used  Vaping Use  . Vaping Use: Every day  Substance and Sexual Activity  . Alcohol use: Yes    Alcohol/week: 11.0 standard  drinks    Types: 11 Cans of beer per week  . Drug use: No  . Sexual activity: Never    Partners: Male    Comment: NuvaRing  Other Topics Concern  . Not on file  Social History Narrative   Lives with her husband.     Education: [employer]   Exercise: Yes   Social Determinants of Health   Financial Resource Strain:   . Difficulty of Paying Living Expenses: Not on file  Food Insecurity:   . Worried About Programme researcher, broadcasting/film/video in the Last Year: Not on file  . Ran Out of Food in the Last Year: Not on file  Transportation Needs:   . Lack of Transportation (Medical): Not on file  . Lack of Transportation (Non-Medical): Not on file  Physical Activity:   . Days of Exercise per Week: Not on file  . Minutes of Exercise per Session: Not on file  Stress:   . Feeling of Stress : Not on file  Social Connections:   . Frequency of Communication with Friends and Family: Not on file  . Frequency of Social Gatherings with Friends and Family: Not on file  . Attends Religious Services: Not on file  . Active Member of Clubs or Organizations: Not on file  . Attends Banker Meetings: Not on file  . Marital Status: Not on file  Intimate Partner Violence:   . Fear of Current or Ex-Partner: Not on file  . Emotionally Abused: Not on file  . Physically Abused: Not on file  . Sexually Abused: Not on file    Past Medical History, Surgical history, Social history, and Family history were reviewed and updated as appropriate.   Please see review of systems for further details on the  patient's review from today.   Objective:   Physical Exam:  There were no vitals taken for this visit.  Physical Exam Neurological:     Mental Status: She is alert and oriented to person, place, and time.     Cranial Nerves: No dysarthria.  Psychiatric:        Attention and Perception: Attention normal.        Mood and Affect: Mood is anxious and depressed. Affect is not tearful.        Speech: Speech normal.        Behavior: Behavior is cooperative.        Thought Content: Thought content normal. Thought content is not paranoid or delusional. Thought content does not include homicidal or suicidal ideation. Thought content does not include homicidal or suicidal plan.        Cognition and Memory: Cognition and memory normal.        Judgment: Judgment normal.     Comments: HYpoverbal. Insight ok     Lab Review:     Component Value Date/Time   NA 137 01/04/2017 1420   K 4.0 01/04/2017 1420   CL 98 01/04/2017 1420   CO2 23 01/04/2017 1420   GLUCOSE 86 01/04/2017 1420   GLUCOSE 76 07/08/2015 1440   BUN 6 01/04/2017 1420   CREATININE 0.71 01/04/2017 1420   CREATININE 0.66 07/08/2015 1440   CALCIUM 9.3 01/04/2017 1420   PROT 7.9 01/04/2017 1420   ALBUMIN 3.9 01/04/2017 1420   AST 22 01/04/2017 1420   ALT 26 01/04/2017 1420   ALKPHOS 99 01/04/2017 1420   BILITOT 0.2 01/04/2017 1420   GFRNONAA 111 01/04/2017 1420   GFRAA 128 01/04/2017 1420  Component Value Date/Time   WBC 7.7 01/04/2017 1420   WBC 6.4 07/08/2015 1440   RBC 4.48 01/04/2017 1420   RBC 4.38 07/08/2015 1440   HGB 14.3 01/04/2017 1420   HCT 40.9 01/04/2017 1420   PLT 363 01/04/2017 1420   MCV 91 01/04/2017 1420   MCH 31.9 01/04/2017 1420   MCH 32.4 07/08/2015 1440   MCHC 35.0 01/04/2017 1420   MCHC 34.4 07/08/2015 1440   RDW 14.3 01/04/2017 1420   LYMPHSABS 3.0 01/04/2017 1420   MONOABS 384 07/08/2015 1440   EOSABS 0.0 01/04/2017 1420   BASOSABS 0.0 01/04/2017 1420    No results found for:  POCLITH, LITHIUM   No results found for: PHENYTOIN, PHENOBARB, VALPROATE, CBMZ   .res Assessment: Plan:    Zoe Ryan was seen today for follow-up, anxiety, depression and panic attack.  Diagnoses and all orders for this visit:  Severe bipolar I disorder, current or most recent episode depressed (HCC)  Panic disorder with agoraphobia  Generalized anxiety disorder  Insomnia due to mental condition  Shift work sleep disorder   Daleigh had her diagnosis changed from major depression to bipolar depression in March.   Her depression and anxiety had been treatment resistant.  Her depression, panic and generalized anxiety disorder are all chronic.  As of her last appointment in May to this appointment anxiety has worsened again.    She is tolerating the current medications.  She is not having mood swings.  Work function is okay.   Option gabapentin.  The alternative of gabapentin would take a longer period of adjustment and is unlikely to have any potential to help with depressive symptoms.  The paroxetine is being used for panic disorder but there could be consideration to increase it in hopes of helping with depression as long as she is on an adequate mood stabilizer.  There is risk of mood cycling with SSRIs and bipolar patients.  She has been able to reduce the daytime dose of Xanax XR substantially down to about 1 mg a day from 3 mg daily.  Discussed potential metabolic side effects associated with atypical antipsychotics, as well as potential risk for movement side effects. Advised pt to contact office if movement side effects occur.   For treatment resistant bipolar depression consider switch to [facility] though she has had a partial response with Vraylar.  Statistically these are the options remaining that have the best odds of helping her bipolar depression. Consider ECT. Off label option pramipexole for TRD.   Increase Seroquel XR to 1200 mg daily for TR bipolar depression and reduction in  anxiety.  Disc SE.  High dose for TR sx bc tolerating it well and limited options remain.  Consider Increase but continue for now paroxetine 40 mg daily for panic and anxiety bc poor control  Shift work managed with Ronne Binning  Seeing therapist Jannet Askew and hasn't discussed traumatic event from the past yet.  Just realized this yesterday.  FU 4 weeks, if fails switch to Desma Mcgregor, MD, DFAPA   Please see After Visit Summary for patient specific instructions.  No future appointments.  No orders of the defined types were placed in this encounter.   No future appointments.  No orders of the defined types were placed in this encounter.     -------------------------------

## 2020-01-14 ENCOUNTER — Other Ambulatory Visit: Payer: Self-pay | Admitting: Psychiatry

## 2020-01-14 DIAGNOSIS — F4001 Agoraphobia with panic disorder: Secondary | ICD-10-CM

## 2020-01-14 DIAGNOSIS — F5105 Insomnia due to other mental disorder: Secondary | ICD-10-CM

## 2020-01-14 DIAGNOSIS — F411 Generalized anxiety disorder: Secondary | ICD-10-CM

## 2020-01-24 ENCOUNTER — Telehealth: Payer: Self-pay | Admitting: Psychiatry

## 2020-01-24 ENCOUNTER — Other Ambulatory Visit: Payer: Self-pay | Admitting: Psychiatry

## 2020-01-24 NOTE — Telephone Encounter (Signed)
Please review

## 2020-01-24 NOTE — Telephone Encounter (Signed)
Pt LM on VM her Rx for Alprazolam XR was decreased from 3/d to 1/d. Stated she will run out too soon. Contact # [phone]

## 2020-01-24 NOTE — Telephone Encounter (Signed)
01/07/20 note by [facility] Heavy EtOH use prior to [facility] on April 2021 with recommended taper therafter. Started Optifast plan in May 2021. Follow knee surgery, states she relapsed EtOH use. As above, now having 4-5 drinks per night.   BC of this we are limiting Xanax.  But will not stop yet DT risk withdrawal problems is great.  She has appt here soon.  Will put her on cancellation list.

## 2020-01-30 ENCOUNTER — Telehealth (INDEPENDENT_AMBULATORY_CARE_PROVIDER_SITE_OTHER): Payer: No Typology Code available for payment source | Admitting: Psychiatry

## 2020-01-30 ENCOUNTER — Encounter: Payer: Self-pay | Admitting: Psychiatry

## 2020-01-30 DIAGNOSIS — F314 Bipolar disorder, current episode depressed, severe, without psychotic features: Secondary | ICD-10-CM

## 2020-01-30 DIAGNOSIS — G4726 Circadian rhythm sleep disorder, shift work type: Secondary | ICD-10-CM

## 2020-01-30 DIAGNOSIS — F4001 Agoraphobia with panic disorder: Secondary | ICD-10-CM | POA: Diagnosis not present

## 2020-01-30 DIAGNOSIS — F411 Generalized anxiety disorder: Secondary | ICD-10-CM

## 2020-01-30 DIAGNOSIS — F10220 Alcohol dependence with intoxication, uncomplicated: Secondary | ICD-10-CM

## 2020-01-30 DIAGNOSIS — F5105 Insomnia due to other mental disorder: Secondary | ICD-10-CM | POA: Diagnosis not present

## 2020-01-30 MED ORDER — GABAPENTIN 300 MG PO CAPS: ORAL_CAPSULE | ORAL | 1 refills | Status: DC

## 2020-01-30 NOTE — Progress Notes (Signed)
Zoe Ryan [id number] September 27, 1980 39 y.o.   Video Visit via My Chart  I connected with pt by My Chart and verified that I am speaking with the correct person using two identifiers.   I discussed the limitations, risks, security and privacy concerns of performing an evaluation and management service by My Chart  and the availability of in person appointments. I also discussed with the patient that there may be a patient responsible charge related to this service. The patient expressed understanding and agreed to proceed.  I discussed the assessment and treatment plan with the patient. The patient was provided an opportunity to ask questions and all were answered. The patient agreed with the plan and demonstrated an understanding of the instructions.   The patient was advised to call back or seek an in-person evaluation if the symptoms worsen or if the condition fails to improve as anticipated.  I provided 30 minutes of video time during this encounter.  The patient was located at home and the provider was located office. Started 200 and ended 230   Subjective:   Patient ID:  Zoe Ryan is a 39 y.o. (DOB 1980/07/06) female.  Chief Complaint:  Chief Complaint  Patient presents with  . Follow-up  . Anxiety  . Depression  . Drug / Alcohol Assessment    Anxiety Symptoms include nervous/anxious behavior. Patient reports no confusion, decreased concentration, dizziness, nausea or suicidal ideas.    Depression        Associated symptoms include no decreased concentration, no fatigue and no suicidal ideas.  Past medical history includes anxiety.      Zoe Ryan is seen again today urgently because of recent severe TR bipolar depressive and anxiety symptoms that have been interfering with her ability to work.  AT visit August 10, 2018 when for moderately severe anxiety she was instructed to increase sertraline  from 50 to 75 mg daily.  She's only been taking 50 sertraline bc forgot to  increase it.  She was also continued on the recently increased Vraylar 6 mg daily.    At visit August 2020.  Patient was encouraged to increase sertraline from 50 to 75 mg again for panic and anxiety.  She had not done so previously as suggested.  She was not improved as of the last visit. Increased sertraline from 50 to 75 for anxiety and panic.  Continued Wellbutrin 450, Vraylar 6, Xanax XR 1 mg TID, Nuvigil 250, Sonata HS.   visit October 19 and the following changes were made: Increase sertraline further to 100 mg in hopes of further reducing generalized anxiety disorder and depression.  The sertraline has blocked of the panic attacks.  It made a little difference but not that much.  Anxiety is worse than depression.   visit February 23, 2019.  She was taking sertraline 100 mg daily.  She felt the sertraline was causing some irritability.  We switched to paroxetine 20 mg.  Noticed a decrease in depression and anxiety and not as irritable with switch to paroxetine.  Tolerating well at HS. Daytime paroxetine dizzy.  Improvement plateau.  dep 4/10.  Anxiety 5/10.   Less work anxiety and a bit more satisfied overall.  Less tearful.   No mood swings.  Anxiety reduced to with work..  No recent panic.  A little more energy.  Still fairly Low motivation and interest.  Still Want to sleep and watch TV.  Stopped exercise bc motivation but trying to get better.  Poor productivity at home  without change. 1 Missed days work DT depression.  Functioning well at work. Intermittent FMLA .  Can struggle to maintain job interest but may be passing.  Been there 10 years.  No mood swings.  Can enjoy things. Good sleep.  Normally 8-9 hours sleep.  On weekend can sleep 10 hours then nap.  seen April 06, 2019.  The following change was made:  Increase paroxetine to 30 mg daily to further reduce anxiety and potentially depression .  She remained on Vraylar 6 mg, Wellbutrin XL 450,  Nuvigil 250 mg, Xanax XR 1 mg 3 times  daily. She called back March 11 complaining of feeling numb all the time as well as depression and weight gain.  It was affecting work.  We will put her on the cancellation list to try to get an appointment as soon as possible as there was not an obvious med adjustment that could be made outside of an appointment. She cut back the paroxetine to 20 mg bc gain 15#, feeling numb, and trouble concentrating about March 11.   07/11/2019 appointment with the following noted: Doing better overall with SE paroxetine.  A little more focus.  Feeling less numb.  Dep 5/10.  Anxiety 6/10.  Not much progress over the last couple of mos.  Still low interest, enjoyment, motivation.  Missed 1 day in last month but running late and leaving early.  No panic.  Thinks paroxetine helps anxiety some.  Sticking with 3 daily of Xanax XR 1 mg.  Xanax 0.5 about 7 times per week for sleep. Sleep good with more disciplined schedule helped. 8 hours.  If can will sleep a lot.  No NM. Plan:Increase Seroquel XR to 300 mg daily for TR bipolar depression.  Disc SE.   Try to wean HS Xanax.  08/16/2019 appointment with the following noted: No additional improvement with increase Seroquel but no addional SE either. Dep & anxiety 6-7/10 worse than last time. Work function reduced and had to leave early a couple of days.  Couple of panic before work. Sleep variable from normal to excessive.  Started a new diet with restrictive calories and reinjured knee only sig stressors. Plan: For treatment resistant bipolar depression consider switch to [facility] though she has had a partial response with Vraylar.  Statistically these are the options remaining that have the best odds of helping her bipolar depression. Consider ECT. Off label option pramipexole for TRD.  Increase Seroquel XR to 600 mg daily for TR bipolar depression.  Disc SE.   Try to wean HS Xanax.  Call before July 1 if no improvement and we'll consider increasing the Seroquel XR to 800 mg  daily.  09/24/2019 phone call with patient requesting early refill of Xanax saying she had been very stressed out and had taken more than prescribed MD response:  She is already on a high dose of Xanax and exceeding the dosage Rx and running out a month early is entirely unacceptable and puts her at risk of seizures.  She is getting 2 different forms of Xanax.  PDMP shows: 07/24/2019  2   07/24/2019  Alprazolam 0.5 MG Tablet  90.00  90 Ca Cot   [id number]   Nor ([id number])   0/0  1.00 LME  Comm Ins   [place]  07/24/2019  2   07/24/2019  Alprazolam Xr 1 MG Tablet  270.00  90           If she cannot comply I will take her off  the med.  To more closely monitor her compliance I will RX it to her 1 week at a time until we can establish her ability to comply. I sent in 1 week supply of each.  11/07/2019 appointment with the following noted: Never ran out of Xanax but feared it.  Was highly anxious anticipating surgery 3-4 weeks ago.  Went well  No complications and getting back to normal work now.  At work last week. Increase Seroquel XR to 600 mg nightly.  Was in a lot of pain at the time and hard to say the effect.   Depression 4/10, anxiety 6/10.  Sleeping well.  8 hours.  More on weekends.  No SE. Has panic at least 2 / week and usually triggered by work or social unfamiliar places.   No mania nor mood swings or agitation.  Work is best function in a long time. Plan: Increase Seroquel XR to 800 mg daily for TR bipolar depression and reduction in anxiety.  Disc SE.   Increase paroxetine 40 mg daily for panic and anxiety bc poor control  01/07/2020 appointment with the following noted: Had 2-3 happy days but otherwise not much change.  Was quite sick last week which was horrible.Johnnette Barrios. Wonders about dx criteria for PTSD.  Had a rough time 15 years ago without meds.  Lived in house with friends and it ballooned into weird gaslighting of people doing things behind her back.  Was depressed and self medicating  with alcohol.  If get this slight feeling or rejection then all the old hurt comes back.  This might be why haven't made new friends since that incident.  Sounds benign but they really messed with my head.  Made me sound so paranoid and delusional which reinforced their idea she wasn't welcome anymore.  Still getting triggered by silly stuff like that.  Anything that reminds me of that makes it hard to function.  No SE.  Sleeping long bc recently sick and before that was 8-10 hours. 1 panic since here on Monday morning. depression 6/10 and anxiety 4/10. Occ crying spells.  Sometimes interest.   Gained weight over time since broken leg and change in job or marriage.  In weight management program now.  Plan: Increase Seroquel XR to 1200 mg daily for TR bipolar depression and reduction in anxiety.  Disc SE.  High dose for TR sx bc tolerating it well and limited options remain. Consider Increase but continue for now paroxetine 40 mg daily for panic and anxiety bc poor control  01/24/20 note: [facility] recommended taper therafter. Started Optifast plan in May 2021. Follow knee surgery, states she relapsed EtOH use. As above, now having 4-5 drinks per night.   BC of this we are limiting Xanax.  But will not stop yet DT risk withdrawal problems is great.  She has appt here soon.  Will put her on cancellation list.  01/30/20 appt with following noted: Pt now saying she's only drinking 1-2 drinks nightly.  Admits relapse into alcohol for about a year.  Doesn't think she's ready to stop drinking. Never had any alcohol treatment.  In past drug problems, marijuana and hallucinogens.  Denies opiate abuse.   Pat FHx D&A abuse.  Seeing therapist every other week. No noticeable change with med changes. Mood labillity with crying at one point and couple hours later laughing and feeling like a grand old time  without reason.  Most  days labile. Probably worse with increase paroxetine to 40 mg daily. No SE with meds.   No SI but did have self harm thoughts like cutting with knife when feeling down.   Last cutting a decade ago. More anxious. And more panic  In March 2020 her diagnosis was changed to bipolar depression.    Failed psychiatric medications include:  Lexapro,  paroxetine 40 mood lability,  sertraline 100 more irritable,  fluoxetine, venlafaxine, Wellbutrin 450, duloxetine 120  ,  Failed to respond to lexapro plus Rexulti lithium,  lamotrigine doesn't remember why she stopped, valproic acid, Abilify, lithium, Rexulti,  She says the propranolol is not very helpful for anxiety, but taken it once or twice for tremor with anxiety. Had some mood improvement in stability with increase Vraylar to 6. Seroquel XR 600 mg daily. Ambien amnesia  Review of Systems:  Review of Systems  Constitutional: Negative for fatigue.  Gastrointestinal: Negative for diarrhea and nausea.  Genitourinary:       Stress diarrhea resolved    Musculoskeletal: Positive for arthralgias.  Neurological: Negative for dizziness, tremors and weakness.  Psychiatric/Behavioral: Positive for depression and dysphoric mood. Negative for agitation, behavioral problems, confusion, decreased concentration, hallucinations, self-injury, sleep disturbance and suicidal ideas. The patient is nervous/anxious. The patient is not hyperactive.     Medications: I have reviewed the patient's current medications.  Current Outpatient Medications  Medication Sig Dispense Refill  . Armodafinil 250 MG tablet Take 1 tablet (250 mg total) by mouth daily. 90 tablet 0  . etonogestrel (NEXPLANON) 68 MG IMPL implant 1 each by Subdermal route once.    . Multiple Vitamin (MULITIVITAMIN WITH MINERALS) TABS Take 1 tablet by mouth daily.      Marland Kitchen PARoxetine (PAXIL) 20 MG tablet Take 1.5 tablets (30 mg total) by mouth daily. (Patient taking differently: Take 40 mg by  mouth daily. ) 135 tablet 0  . QUEtiapine (SEROQUEL XR) 400 MG 24 hr tablet Take 3 tablets (1,200 mg total) by mouth at bedtime. 90 tablet 1  . zaleplon (SONATA) 10 MG capsule Take 1 capsule (10 mg total) by mouth at bedtime. 30 capsule 1  . gabapentin (NEURONTIN) 300 MG capsule 1 capsule 3 times daily for 5 days, then 2 capsules 3 times daily. 180 capsule 1   No current facility-administered medications for this visit.    Medication Side Effects: None  Allergies: No Known Allergies  Past Medical History:  Diagnosis Date  . Anxiety   . Chronic shoulder pain   . Depression   . History of tobacco use    Quit 2013    Family History  Problem Relation Age of Onset  . Anxiety disorder Maternal Grandmother   . Mental retardation Maternal Grandmother   . Congestive Heart Failure Maternal Grandfather   . Cancer Maternal Grandfather        lymphoma  . Stroke Paternal Grandfather   . Mental illness Brother        depression  . Hyperlipidemia Brother     Social History   Socioeconomic History  . Marital status: Divorced    Spouse name: [name]  . Number of children: 0  . Years of education: College  . Highest education level: Not on file  Occupational History  . Occupation: Building control surveyor: [employer]  Tobacco Use  . Smoking status: Former Games developer  . Smokeless tobacco: Never Used  Vaping Use  . Vaping Use: Every day  Substance and  Sexual Activity  . Alcohol use: Yes    Alcohol/week: 11.0 standard drinks    Types: 11 Cans of beer per week  . Drug use: No  . Sexual activity: Never    Partners: Male    Comment: NuvaRing  Other Topics Concern  . Not on file  Social History Narrative   Lives with her husband.     Education: [employer]   Exercise: Yes   Social Determinants of Health   Financial Resource Strain:   . Difficulty of Paying Living Expenses: Not on file  Food Insecurity:   . Worried About Programme researcher, broadcasting/film/video in the Last Year: Not on file  . Ran Out  of Food in the Last Year: Not on file  Transportation Needs:   . Lack of Transportation (Medical): Not on file  . Lack of Transportation (Non-Medical): Not on file  Physical Activity:   . Days of Exercise per Week: Not on file  . Minutes of Exercise per Session: Not on file  Stress:   . Feeling of Stress : Not on file  Social Connections:   . Frequency of Communication with Friends and Family: Not on file  . Frequency of Social Gatherings with Friends and Family: Not on file  . Attends Religious Services: Not on file  . Active Member of Clubs or Organizations: Not on file  . Attends Banker Meetings: Not on file  . Marital Status: Not on file  Intimate Partner Violence:   . Fear of Current or Ex-Partner: Not on file  . Emotionally Abused: Not on file  . Physically Abused: Not on file  . Sexually Abused: Not on file    Past Medical History, Surgical history, Social history, and Family history were reviewed and updated as appropriate.   Please see review of systems for further details on the patient's review from today.   Objective:   Physical Exam:  There were no vitals taken for this visit.  Physical Exam Neurological:     Mental Status: She is alert and oriented to person, place, and time.     Cranial Nerves: No dysarthria.  Psychiatric:        Attention and Perception: Attention normal.        Mood and Affect: Mood is anxious and depressed. Affect is not tearful.        Speech: Speech normal. Speech is not slurred.        Behavior: Behavior is cooperative.        Thought Content: Thought content normal. Thought content is not paranoid or delusional. Thought content does not include homicidal or suicidal ideation. Thought content does not include homicidal or suicidal plan.        Cognition and Memory: Cognition and memory normal.        Judgment: Judgment normal.     Comments: Less HYpoverbal. Insight ok Less depressed than last time.      Review of  Systems:  Review of Systems  Constitutional: Negative for fatigue.  Gastrointestinal: Negative for diarrhea.  Genitourinary:       Stress diarrhea resolved    Neurological: Negative for dizziness, tremors and weakness.  Psychiatric/Behavioral: Positive for depression and dysphoric mood. Negative for agitation, behavioral problems, confusion, decreased concentration, hallucinations, self-injury, sleep disturbance and suicidal ideas. The patient is nervous/anxious. The patient is not hyperactive.     Medications: I have reviewed the patient's current medications.  Current Outpatient Medications  Medication Sig Dispense Refill  . Armodafinil 250  MG tablet Take 1 tablet (250 mg total) by mouth daily. 90 tablet 0  . etonogestrel (NEXPLANON) 68 MG IMPL implant 1 each by Subdermal route once.    . Multiple Vitamin (MULITIVITAMIN WITH MINERALS) TABS Take 1 tablet by mouth daily.      Marland Kitchen PARoxetine (PAXIL) 20 MG tablet Take 1.5 tablets (30 mg total) by mouth daily. (Patient taking differently: Take 40 mg by mouth daily. ) 135 tablet 0  . QUEtiapine (SEROQUEL XR) 400 MG 24 hr tablet Take 3 tablets (1,200 mg total) by mouth at bedtime. 90 tablet 1  . zaleplon (SONATA) 10 MG capsule Take 1 capsule (10 mg total) by mouth at bedtime. 30 capsule 1  . gabapentin (NEURONTIN) 300 MG capsule 1 capsule 3 times daily for 5 days, then 2 capsules 3 times daily. 180 capsule 1   No current facility-administered medications for this visit.    Medication Side Effects: dry mouth  Allergies: No Known Allergies  Past Medical History:  Diagnosis Date  . Anxiety   . Chronic shoulder pain   . Depression   . History of tobacco use    Quit 2013    Family History  Problem Relation Age of Onset  . Anxiety disorder Maternal Grandmother   . Mental retardation Maternal Grandmother   . Congestive Heart Failure Maternal Grandfather   . Cancer Maternal Grandfather        lymphoma  . Stroke Paternal Grandfather    . Mental illness Brother        depression  . Hyperlipidemia Brother     Social History   Socioeconomic History  . Marital status: Divorced    Spouse name: [name]  . Number of children: 0  . Years of education: College  . Highest education level: Not on file  Occupational History  . Occupation: Building control surveyor: [employer]  Tobacco Use  . Smoking status: Former Games developer  . Smokeless tobacco: Never Used  Vaping Use  . Vaping Use: Every day  Substance and Sexual Activity  . Alcohol use: Yes    Alcohol/week: 11.0 standard drinks    Types: 11 Cans of beer per week  . Drug use: No  . Sexual activity: Never    Partners: Male    Comment: NuvaRing  Other Topics Concern  . Not on file  Social History Narrative   Lives with her husband.     Education: [employer]   Exercise: Yes   Social Determinants of Health   Financial Resource Strain:   . Difficulty of Paying Living Expenses: Not on file  Food Insecurity:   . Worried About Programme researcher, broadcasting/film/video in the Last Year: Not on file  . Ran Out of Food in the Last Year: Not on file  Transportation Needs:   . Lack of Transportation (Medical): Not on file  . Lack of Transportation (Non-Medical): Not on file  Physical Activity:   . Days of Exercise per Week: Not on file  . Minutes of Exercise per Session: Not on file  Stress:   . Feeling of Stress : Not on file  Social Connections:   . Frequency of Communication with Friends and Family: Not on file  . Frequency of Social Gatherings with Friends and Family: Not on file  . Attends Religious Services: Not on file  . Active Member of Clubs or Organizations: Not on file  . Attends Banker Meetings: Not on file  . Marital Status: Not on file  Intimate Partner Violence:   . Fear of Current or Ex-Partner: Not on file  . Emotionally Abused: Not on file  . Physically Abused: Not on file  . Sexually Abused: Not on file    Past Medical History, Surgical history,  Social history, and Family history were reviewed and updated as appropriate.   Please see review of systems for further details on the patient's review from today.   Objective:   Physical Exam:  There were no vitals taken for this visit.  Physical Exam Neurological:     Mental Status: She is alert and oriented to person, place, and time.     Cranial Nerves: No dysarthria.  Psychiatric:        Attention and Perception: Attention normal.        Mood and Affect: Mood is anxious and depressed. Affect is not tearful.        Speech: Speech normal.        Behavior: Behavior is cooperative.        Thought Content: Thought content normal. Thought content is not paranoid or delusional. Thought content does not include homicidal or suicidal ideation. Thought content does not include homicidal or suicidal plan.        Cognition and Memory: Cognition and memory normal.        Judgment: Judgment normal.     Comments: HYpoverbal. Insight ok     Lab Review:     Component Value Date/Time   NA 137 01/04/2017 1420   K 4.0 01/04/2017 1420   CL 98 01/04/2017 1420   CO2 23 01/04/2017 1420   GLUCOSE 86 01/04/2017 1420   GLUCOSE 76 07/08/2015 1440   BUN 6 01/04/2017 1420   CREATININE 0.71 01/04/2017 1420   CREATININE 0.66 07/08/2015 1440   CALCIUM 9.3 01/04/2017 1420   PROT 7.9 01/04/2017 1420   ALBUMIN 3.9 01/04/2017 1420   AST 22 01/04/2017 1420   ALT 26 01/04/2017 1420   ALKPHOS 99 01/04/2017 1420   BILITOT 0.2 01/04/2017 1420   GFRNONAA 111 01/04/2017 1420   GFRAA 128 01/04/2017 1420       Component Value Date/Time   WBC 7.7 01/04/2017 1420   WBC 6.4 07/08/2015 1440   RBC 4.48 01/04/2017 1420   RBC 4.38 07/08/2015 1440   HGB 14.3 01/04/2017 1420   HCT 40.9 01/04/2017 1420   PLT 363 01/04/2017 1420   MCV 91 01/04/2017 1420   MCH 31.9 01/04/2017 1420   MCH 32.4 07/08/2015 1440   MCHC 35.0 01/04/2017 1420   MCHC 34.4 07/08/2015 1440   RDW 14.3 01/04/2017 1420   LYMPHSABS 3.0  01/04/2017 1420   MONOABS 384 07/08/2015 1440   EOSABS 0.0 01/04/2017 1420   BASOSABS 0.0 01/04/2017 1420    No results found for: POCLITH, LITHIUM   No results found for: PHENYTOIN, PHENOBARB, VALPROATE, CBMZ   .res Assessment: Plan:    Jubilee was seen today for follow-up, anxiety, depression and drug / alcohol assessment.  Diagnoses and all orders for this visit:  Severe bipolar I disorder, current or most recent episode depressed (HCC)  Panic disorder with agoraphobia -     gabapentin (NEURONTIN) 300 MG capsule; 1 capsule 3 times daily for 5 days, then 2 capsules 3 times daily.  Generalized anxiety disorder  Insomnia due to mental condition  Shift work sleep disorder  Alcohol dependence with uncomplicated intoxication (HCC) -     gabapentin (NEURONTIN) 300 MG capsule; 1 capsule 3 times daily for 5 days,  then 2 capsules 3 times daily.   Delyla had her diagnosis changed from major depression to bipolar depression in March.   Her depression and anxiety had been treatment resistant.  Her depression, panic and generalized anxiety disorder are all chronic.  It was discovered from a note with bariatric services at [facility] that the patient's drinking and admitted 4-5 drinks per night.  She admits now that she had relapsed on alcohol more significantly about a year ago and had been underreporting the amount she was drinking.  Discussed at length that this could be interfering with the effectiveness of her treatment of depression and anxiety.  However she states she is not willing to stop drinking at this time.  Encouraged her to discuss these issues with her therapist as well which she indicated she would.  Discussed the risk especially of benzodiazepine use with alcohol and we would need to stop the benzodiazepines.  Discussed the risk of withdrawal coming off the benzodiazepines we will prescribe gabapentin to help with this.   Option gabapentin.  The alternative of gabapentin would  take a longer period of adjustment and is unlikely to have any potential to help with depressive symptoms.  The paroxetine is being used for panic disorder but there could be consideration to increase it in hopes of helping with depression as long as she is on an adequate mood stabilizer.  There is risk of mood cycling with SSRIs and bipolar patients.  Start gabapentin 300 mg TID for anxiety off label and help ease WD from Xanax and it might help with alcohol abuse.  Increase gabapentin as tolerated up to 600 mg TID.   DC Xanax bc Alcohol abuse.  Discussed potential metabolic side effects associated with atypical antipsychotics, as well as potential risk for movement side effects. Advised pt to contact office if movement side effects occur.   For treatment resistant bipolar depression consider switch to [facility] though she has had a partial response with Vraylar.  Statistically these are the options remaining that have the best odds of helping her bipolar depression. Consider ECT. Off label option pramipexole for TRD.   Continue Seroquel XR to 1200 mg daily for TR bipolar depression and reduction in anxiety.  Disc SE.  High dose for TR sx bc tolerating it well and limited options remain.  Reduce paroxetine 30 mg daily  DT mood lability with 40 mg daily and hopefully still help anxiety  Shift work managed with Ronne Binning  Seeing therapist Jannet Askew and hasn't discussed traumatic event from the past yet.  Just realized this yesterday.  FU 4 weeks, if fails switch to Desma Mcgregor, MD, DFAPA   Please see After Visit Summary for patient specific instructions.  No future appointments.  No orders of the defined types were placed in this encounter.   No future appointments.  No orders of the defined types were placed in this encounter.     -------------------------------

## 2020-02-04 ENCOUNTER — Telehealth: Payer: No Typology Code available for payment source | Admitting: Psychiatry

## 2020-02-04 ENCOUNTER — Other Ambulatory Visit: Payer: Self-pay | Admitting: Psychiatry

## 2020-02-07 ENCOUNTER — Other Ambulatory Visit: Payer: Self-pay | Admitting: Psychiatry

## 2020-02-07 DIAGNOSIS — F4001 Agoraphobia with panic disorder: Secondary | ICD-10-CM

## 2020-02-07 DIAGNOSIS — F411 Generalized anxiety disorder: Secondary | ICD-10-CM

## 2020-02-18 ENCOUNTER — Encounter: Payer: Self-pay | Admitting: Psychiatry

## 2020-02-18 ENCOUNTER — Telehealth (INDEPENDENT_AMBULATORY_CARE_PROVIDER_SITE_OTHER): Payer: No Typology Code available for payment source | Admitting: Psychiatry

## 2020-02-18 DIAGNOSIS — G4726 Circadian rhythm sleep disorder, shift work type: Secondary | ICD-10-CM

## 2020-02-18 DIAGNOSIS — F411 Generalized anxiety disorder: Secondary | ICD-10-CM | POA: Diagnosis not present

## 2020-02-18 DIAGNOSIS — F4001 Agoraphobia with panic disorder: Secondary | ICD-10-CM

## 2020-02-18 DIAGNOSIS — F314 Bipolar disorder, current episode depressed, severe, without psychotic features: Secondary | ICD-10-CM

## 2020-02-18 DIAGNOSIS — F10220 Alcohol dependence with intoxication, uncomplicated: Secondary | ICD-10-CM

## 2020-02-18 DIAGNOSIS — F5105 Insomnia due to other mental disorder: Secondary | ICD-10-CM

## 2020-02-18 MED ORDER — GABAPENTIN 300 MG PO CAPS: 900.0000 mg | ORAL_CAPSULE | Freq: Three times a day (TID) | ORAL | 1 refills | Status: DC

## 2020-02-18 NOTE — Progress Notes (Signed)
Zoe Ryan [id number] 1980-11-24 39 y.o.   Video Visit via My Chart  I connected with pt by My Chart and verified that I am speaking with the correct person using two identifiers.   I discussed the limitations, risks, security and privacy concerns of performing an evaluation and management service by My Chart  and the availability of in person appointments. I also discussed with the patient that there may be a patient responsible charge related to this service. The patient expressed understanding and agreed to proceed.  I discussed the assessment and treatment plan with the patient. The patient was provided an opportunity to ask questions and all were answered. The patient agreed with the plan and demonstrated an understanding of the instructions.   The patient was advised to call back or seek an in-person evaluation if the symptoms worsen or if the condition fails to improve as anticipated.  I provided 30 minutes of video time during this encounter.  The patient was located at home and the provider was located office. Started 145 until 215   Subjective:   Patient ID:  Zoe Ryan is a 39 y.o. (DOB 17-May-1980) female.  Chief Complaint:  Chief Complaint  Patient presents with  . Follow-up  . Depression  . Anxiety    Anxiety Symptoms include nervous/anxious behavior. Patient reports no confusion, decreased concentration, dizziness, nausea, palpitations or suicidal ideas.    Depression        Associated symptoms include no decreased concentration, no fatigue and no suicidal ideas.  Past medical history includes anxiety.      Zoe Ryan is seen again today urgently because of recent severe TR bipolar depressive and anxiety symptoms that have been interfering with her ability to work.  AT visit August 10, 2018 when for moderately severe anxiety she was instructed to increase sertraline  from 50 to 75 mg daily.  She's only been taking 50 sertraline bc forgot to increase it.  She was  also continued on the recently increased Vraylar 6 mg daily.    At visit August 2020.  Patient was encouraged to increase sertraline from 50 to 75 mg again for panic and anxiety.  She had not done so previously as suggested.  She was not improved as of the last visit. Increased sertraline from 50 to 75 for anxiety and panic.  Continued Wellbutrin 450, Vraylar 6, Xanax XR 1 mg TID, Nuvigil 250, Sonata HS.   visit October 19 and the following changes were made: Increase sertraline further to 100 mg in hopes of further reducing generalized anxiety disorder and depression.  The sertraline has blocked of the panic attacks.  It made a little difference but not that much.  Anxiety is worse than depression.   visit February 23, 2019.  She was taking sertraline 100 mg daily.  She felt the sertraline was causing some irritability.  We switched to paroxetine 20 mg.  Noticed a decrease in depression and anxiety and not as irritable with switch to paroxetine.  Tolerating well at HS. Daytime paroxetine dizzy.  Improvement plateau.  dep 4/10.  Anxiety 5/10.   Less work anxiety and a bit more satisfied overall.  Less tearful.   No mood swings.  Anxiety reduced to with work..  No recent panic.  A little more energy.  Still fairly Low motivation and interest.  Still Want to sleep and watch TV.  Stopped exercise bc motivation but trying to get better.  Poor productivity at home without change. 1 Missed days work  DT depression.  Functioning well at work. Intermittent FMLA .  Can struggle to maintain job interest but may be passing.  Been there 10 years.  No mood swings.  Can enjoy things. Good sleep.  Normally 8-9 hours sleep.  On weekend can sleep 10 hours then nap.  seen April 06, 2019.  The following change was made:  Increase paroxetine to 30 mg daily to further reduce anxiety and potentially depression .  She remained on Vraylar 6 mg, Wellbutrin XL 450,  Nuvigil 250 mg, Xanax XR 1 mg 3 times daily. She called back  March 11 complaining of feeling numb all the time as well as depression and weight gain.  It was affecting work.  We will put her on the cancellation list to try to get an appointment as soon as possible as there was not an obvious med adjustment that could be made outside of an appointment. She cut back the paroxetine to 20 mg bc gain 15#, feeling numb, and trouble concentrating about March 11.   07/11/2019 appointment with the following noted: Doing better overall with SE paroxetine.  A little more focus.  Feeling less numb.  Dep 5/10.  Anxiety 6/10.  Not much progress over the last couple of mos.  Still low interest, enjoyment, motivation.  Missed 1 day in last month but running late and leaving early.  No panic.  Thinks paroxetine helps anxiety some.  Sticking with 3 daily of Xanax XR 1 mg.  Xanax 0.5 about 7 times per week for sleep. Sleep good with more disciplined schedule helped. 8 hours.  If can will sleep a lot.  No NM. Plan:Increase Seroquel XR to 300 mg daily for TR bipolar depression.  Disc SE.   Try to wean HS Xanax.  08/16/2019 appointment with the following noted: No additional improvement with increase Seroquel but no addional SE either. Dep & anxiety 6-7/10 worse than last time. Work function reduced and had to leave early a couple of days.  Couple of panic before work. Sleep variable from normal to excessive.  Started a new diet with restrictive calories and reinjured knee only sig stressors. Plan: For treatment resistant bipolar depression [facility] though she has had a partial response with Vraylar.  Statistically these are the options remaining that have the best odds of helping her bipolar depression. Consider ECT. Off label option pramipexole for TRD.  Increase Seroquel XR to 600 mg daily for TR bipolar depression.  Disc SE.   Try to wean HS Xanax.  Call before July 1 if no improvement and we'll consider increasing the Seroquel XR to 800 mg daily.  09/24/2019  phone call with patient requesting early refill of Xanax saying she had been very stressed out and had taken more than prescribed MD response:  She is already on a high dose of Xanax and exceeding the dosage Rx and running out a month early is entirely unacceptable and puts her at risk of seizures.  She is getting 2 different forms of Xanax.  PDMP shows: 07/24/2019  2   07/24/2019  Alprazolam 0.5 MG Tablet  90.00  90 Ca Cot   [id number]   [place] ([id number])   0/0  1.00 LME  Comm Ins   [place]  07/24/2019  2   07/24/2019  Alprazolam Xr 1 MG Tablet  270.00  90           If she cannot comply I will take her off the med.  To more closely  monitor her compliance I will RX it to her 1 week at a time until we can establish her ability to comply. I sent in 1 week supply of each.  11/07/2019 appointment with the following noted: Never ran out of Xanax but feared it.  Was highly anxious anticipating surgery 3-4 weeks ago.  Went well  No complications and getting back to normal work now.  At work last week. Increase Seroquel XR to 600 mg nightly.  Was in a lot of pain at the time and hard to say the effect.   Depression 4/10, anxiety 6/10.  Sleeping well.  8 hours.  More on weekends.  No SE. Has panic at least 2 / week and usually triggered by work or social unfamiliar places.   No mania nor mood swings or agitation.  Work is best function in a long time. Plan: Increase Seroquel XR to 800 mg daily for TR bipolar depression and reduction in anxiety.  Disc SE.   Increase paroxetine 40 mg daily for panic and anxiety bc poor control  01/07/2020 appointment with the following noted: Had 2-3 happy days but otherwise not much change.  Was quite sick last week which was horrible.Zoe Ryan. Wonders about dx criteria for PTSD.  Had a rough time 15 years ago without meds.  Lived in house with friends and it ballooned into weird gaslighting of people doing things behind her back.  Was depressed and self medicating with alcohol.  If get  this slight feeling or rejection then all the old hurt comes back.  This might be why haven't made new friends since that incident.  Sounds benign but they really messed with my head.  Made me sound so paranoid and delusional which reinforced their idea she wasn't welcome anymore.  Still getting triggered by silly stuff like that.  Anything that reminds me of that makes it hard to function.  No SE.  Sleeping long bc recently sick and before that was 8-10 hours. 1 panic since here on Monday morning. depression 6/10 and anxiety 4/10. Occ crying spells.  Sometimes interest.   Gained weight over time since broken leg and change in job or marriage.  In weight management program now.  Plan: Increase Seroquel XR to 1200 mg daily for TR bipolar depression and reduction in anxiety.  Disc SE.  High dose for TR sx bc tolerating it well and limited options remain. Consider Increase but continue for now paroxetine 40 mg daily for panic and anxiety bc poor control  01/24/20 note: [facility] with recommended taper therafter. Started Optifast plan in May 2021. Follow knee surgery, states she relapsed EtOH use. As above, now having 4-5 drinks per night.   BC of this we are limiting Xanax.  But will not stop yet DT risk withdrawal problems is great.  She has appt here soon.  Will put her on cancellation list.  01/30/20 appt with following noted: Pt now saying she's only drinking 1-2 drinks nightly.  Admits relapse into alcohol for about a year.  Doesn't think she's ready to stop drinking. Never had any alcohol treatment.  In past drug problems, marijuana and hallucinogens.  Denies opiate abuse.   Pat FHx D&A abuse.  Seeing therapist every other week. No noticeable change with med changes. Mood labillity with crying at one point and couple hours later laughing and feeling like a grand old time without reason.  Most days labile.  Probably  worse with increase paroxetine to 40 mg daily. No SE with meds.   No SI but did have self harm thoughts like cutting with knife when feeling down.   Last cutting a decade ago. More anxious. And more panic Plan: Continue Seroquel XR to 1200 mg daily for TR bipolar depression and reduction in anxiety.  Disc SE.  High dose for TR sx bc tolerating it well and limited options remain. Reduce paroxetine 30 mg daily  DT mood lability with 40 mg daily and hopefully still help anxiety Start gabapentin 300 mg TID for anxiety off label and help ease WD from Xanax and it might help with alcohol abuse.  Increase gabapentin as tolerated up to 600 mg TID.  DC Xanax bc Alcohol abuse.  02/18/2020 appointment with the following noted: Going OK.  Not a lot of change since here.  Been out of town for 10 days so hard to say.  Gabapentin not that helpful at 600 TID.  No SE. Not as much mood swings.  Depression is not severe 5/10.  Anxiety is 7.5/10. Tolerating meds. Has cut back alcohol to 1-2 beers daily.  Replacing with diet soda or sparkling water.   No cutting or self harm.    Still benefitting from Nuvigil.   In March 2020 her diagnosis was changed to bipolar depression.    Failed psychiatric medications include:  Lexapro,  paroxetine 40 mood lability,  sertraline 100 more irritable,  fluoxetine, venlafaxine, Wellbutrin 450, duloxetine 120  ,  Failed to respond to lexapro plus Rexulti lithium,  lamotrigine doesn't remember why she stopped, valproic acid, Abilify, lithium, Rexulti,  She says the propranolol is not very helpful for anxiety, but taken it once or twice for tremor with anxiety. Had some mood improvement in stability with increase Vraylar to 6. Seroquel XR 600 mg daily. Ambien amnesia  Review of Systems:  Review of Systems  Constitutional: Negative for fatigue.  Cardiovascular: Negative for palpitations.  Gastrointestinal: Negative for diarrhea and nausea.  Genitourinary:        Stress diarrhea resolved    Musculoskeletal: Positive for arthralgias.  Neurological: Positive for tremors. Negative for dizziness and weakness.  Psychiatric/Behavioral: Positive for depression and dysphoric mood. Negative for agitation, behavioral problems, confusion, decreased concentration, hallucinations, self-injury, sleep disturbance and suicidal ideas. The patient is nervous/anxious. The patient is not hyperactive.     Medications: I have reviewed the patient's current medications.  Current Outpatient Medications  Medication Sig Dispense Refill  . Armodafinil 250 MG tablet Take 1 tablet (250 mg total) by mouth daily. 90 tablet 0  . etonogestrel (NEXPLANON) 68 MG IMPL implant 1 each by Subdermal route once.    . Multiple Vitamin (MULITIVITAMIN WITH MINERALS) TABS Take 1 tablet by mouth daily.    Marland Kitchen PARoxetine (PAXIL) 20 MG tablet Take 1.5 tablets (30 mg total) by mouth daily. 135 tablet 0  . QUEtiapine (SEROQUEL XR) 400 MG 24 hr tablet Take 3 tablets (1,200 mg total) by mouth at bedtime. 90 tablet 1  . zaleplon (SONATA) 10 MG capsule Take 1 capsule (10 mg total) by mouth at bedtime. 30 capsule 0  . gabapentin (NEURONTIN) 300 MG capsule Take 3 capsules (900 mg total) by mouth 3 (three) times daily. 270 capsule 1   No current facility-administered medications for this visit.    Medication Side Effects: None  Allergies: No Known Allergies  Past Medical History:  Diagnosis Date  . Anxiety   . Chronic shoulder pain   .  Depression   . History of tobacco use    Quit 2013    Family History  Problem Relation Age of Onset  . Anxiety disorder Maternal Grandmother   . Mental retardation Maternal Grandmother   . Congestive Heart Failure Maternal Grandfather   . Cancer Maternal Grandfather        lymphoma  . Stroke Paternal Grandfather   . Mental illness Brother        depression  . Hyperlipidemia Brother     Social History   Socioeconomic History  . Marital status:  Divorced    Spouse name: [name]  . Number of children: 0  . Years of education: College  . Highest education level: Not on file  Occupational History  . Occupation: Building control surveyor: [employer]  Tobacco Use  . Smoking status: Former Games developer  . Smokeless tobacco: Never Used  Vaping Use  . Vaping Use: Every day  Substance and Sexual Activity  . Alcohol use: Yes    Alcohol/week: 11.0 standard drinks    Types: 11 Cans of beer per week  . Drug use: No  . Sexual activity: Never    Partners: Male    Comment: NuvaRing  Other Topics Concern  . Not on file  Social History Narrative   Lives with her husband.     Education: [employer]   Exercise: Yes   Social Determinants of Corporate investment banker Strain: Not on file  Food Insecurity: Not on file  Transportation Needs: Not on file  Physical Activity: Not on file  Stress: Not on file  Social Connections: Not on file  Intimate Partner Violence: Not on file    Past Medical History, Surgical history, Social history, and Family history were reviewed and updated as appropriate.   Please see review of systems for further details on the patient's review from today.   Objective:   Physical Exam:  There were no vitals taken for this visit.  Physical Exam Neurological:     Mental Status: She is alert and oriented to person, place, and time.     Cranial Nerves: No dysarthria.  Psychiatric:        Attention and Perception: Attention normal.        Mood and Affect: Mood is anxious and depressed. Affect is not tearful.        Speech: Speech normal. Speech is not slurred.        Behavior: Behavior is cooperative.        Thought Content: Thought content normal. Thought content is not paranoid or delusional. Thought content does not include homicidal or suicidal ideation. Thought content does not include homicidal or suicidal plan.        Cognition and Memory: Cognition and memory normal.        Judgment: Judgment normal.      Comments: Less HYpoverbal. Insight ok Less depressed than last time. Anxiety not managed      Review of Systems:  Review of Systems  Constitutional: Negative for fatigue.  Gastrointestinal: Negative for diarrhea.  Genitourinary:       Stress diarrhea resolved    Neurological: Negative for dizziness, tremors and weakness.  Psychiatric/Behavioral: Positive for depression and dysphoric mood. Negative for agitation, behavioral problems, confusion, decreased concentration, hallucinations, self-injury, sleep disturbance and suicidal ideas. The patient is nervous/anxious. The patient is not hyperactive.     Medications: I have reviewed the patient's current medications.  Current Outpatient Medications  Medication Sig Dispense Refill  .  Armodafinil 250 MG tablet Take 1 tablet (250 mg total) by mouth daily. 90 tablet 0  . etonogestrel (NEXPLANON) 68 MG IMPL implant 1 each by Subdermal route once.    . Multiple Vitamin (MULITIVITAMIN WITH MINERALS) TABS Take 1 tablet by mouth daily.    Marland Kitchen PARoxetine (PAXIL) 20 MG tablet Take 1.5 tablets (30 mg total) by mouth daily. 135 tablet 0  . QUEtiapine (SEROQUEL XR) 400 MG 24 hr tablet Take 3 tablets (1,200 mg total) by mouth at bedtime. 90 tablet 1  . zaleplon (SONATA) 10 MG capsule Take 1 capsule (10 mg total) by mouth at bedtime. 30 capsule 0  . gabapentin (NEURONTIN) 300 MG capsule Take 3 capsules (900 mg total) by mouth 3 (three) times daily. 270 capsule 1   No current facility-administered medications for this visit.    Medication Side Effects: dry mouth  Allergies: No Known Allergies  Past Medical History:  Diagnosis Date  . Anxiety   . Chronic shoulder pain   . Depression   . History of tobacco use    Quit 2013    Family History  Problem Relation Age of Onset  . Anxiety disorder Maternal Grandmother   . Mental retardation Maternal Grandmother   . Congestive Heart Failure Maternal Grandfather   . Cancer Maternal Grandfather         lymphoma  . Stroke Paternal Grandfather   . Mental illness Brother        depression  . Hyperlipidemia Brother     Social History   Socioeconomic History  . Marital status: Divorced    Spouse name: [name]  . Number of children: 0  . Years of education: College  . Highest education level: Not on file  Occupational History  . Occupation: Building control surveyor: [employer]  Tobacco Use  . Smoking status: Former Games developer  . Smokeless tobacco: Never Used  Vaping Use  . Vaping Use: Every day  Substance and Sexual Activity  . Alcohol use: Yes    Alcohol/week: 11.0 standard drinks    Types: 11 Cans of beer per week  . Drug use: No  . Sexual activity: Never    Partners: Male    Comment: NuvaRing  Other Topics Concern  . Not on file  Social History Narrative   Lives with her husband.     Education: [employer]   Exercise: Yes   Social Determinants of Corporate investment banker Strain: Not on file  Food Insecurity: Not on file  Transportation Needs: Not on file  Physical Activity: Not on file  Stress: Not on file  Social Connections: Not on file  Intimate Partner Violence: Not on file    Past Medical History, Surgical history, Social history, and Family history were reviewed and updated as appropriate.   Please see review of systems for further details on the patient's review from today.   Objective:   Physical Exam:  There were no vitals taken for this visit.  Physical Exam Neurological:     Mental Status: She is alert and oriented to person, place, and time.     Cranial Nerves: No dysarthria.  Psychiatric:        Attention and Perception: Attention normal.        Mood and Affect: Mood is anxious and depressed. Affect is not tearful.        Speech: Speech normal.        Behavior: Behavior is cooperative.  Thought Content: Thought content normal. Thought content is not paranoid or delusional. Thought content does not include homicidal or suicidal  ideation. Thought content does not include homicidal or suicidal plan.        Cognition and Memory: Cognition and memory normal.        Judgment: Judgment normal.     Comments: HYpoverbal. Insight ok     Lab Review:     Component Value Date/Time   NA 137 01/04/2017 1420   K 4.0 01/04/2017 1420   CL 98 01/04/2017 1420   CO2 23 01/04/2017 1420   GLUCOSE 86 01/04/2017 1420   GLUCOSE 76 07/08/2015 1440   BUN 6 01/04/2017 1420   CREATININE 0.71 01/04/2017 1420   CREATININE 0.66 07/08/2015 1440   CALCIUM 9.3 01/04/2017 1420   PROT 7.9 01/04/2017 1420   ALBUMIN 3.9 01/04/2017 1420   AST 22 01/04/2017 1420   ALT 26 01/04/2017 1420   ALKPHOS 99 01/04/2017 1420   BILITOT 0.2 01/04/2017 1420   GFRNONAA 111 01/04/2017 1420   GFRAA 128 01/04/2017 1420       Component Value Date/Time   WBC 7.7 01/04/2017 1420   WBC 6.4 07/08/2015 1440   RBC 4.48 01/04/2017 1420   RBC 4.38 07/08/2015 1440   HGB 14.3 01/04/2017 1420   HCT 40.9 01/04/2017 1420   PLT 363 01/04/2017 1420   MCV 91 01/04/2017 1420   MCH 31.9 01/04/2017 1420   MCH 32.4 07/08/2015 1440   MCHC 35.0 01/04/2017 1420   MCHC 34.4 07/08/2015 1440   RDW 14.3 01/04/2017 1420   LYMPHSABS 3.0 01/04/2017 1420   MONOABS 384 07/08/2015 1440   EOSABS 0.0 01/04/2017 1420   BASOSABS 0.0 01/04/2017 1420    No results found for: POCLITH, LITHIUM   No results found for: PHENYTOIN, PHENOBARB, VALPROATE, CBMZ   .res Assessment: Plan:    Zoe Ryan was seen today for follow-up, depression and anxiety.  Diagnoses and all orders for this visit:  Severe bipolar I disorder, current or most recent episode depressed (HCC)  Panic disorder with agoraphobia -     gabapentin (NEURONTIN) 300 MG capsule; Take 3 capsules (900 mg total) by mouth 3 (three) times daily.  Generalized anxiety disorder  Insomnia due to mental condition  Shift work sleep disorder  Alcohol dependence with uncomplicated intoxication (HCC) -     gabapentin  (NEURONTIN) 300 MG capsule; Take 3 capsules (900 mg total) by mouth 3 (three) times daily.   Zoe Ryan had her diagnosis changed from major depression to bipolar depression in March.   Her depression and anxiety had been treatment resistant.  Her depression, panic and generalized anxiety disorder are all chronic.  It was discovered from a note with bariatric services at [facility] that the patient's drinking and admitted 4-5 drinks per night.  She admits now that she had relapsed on alcohol more significantly about a year ago and had been underreporting the amount she was drinking.  Discussed at length that this could be interfering with the effectiveness of her treatment of depression and anxiety.  However she states she is not willing to stop drinking at this time.  Encouraged her to discuss these issues with her therapist as well which she indicated she would.  Discussed the risk especially of benzodiazepine use with alcohol and we would need to stop the benzodiazepines.  Discussed the risk of withdrawal coming off the benzodiazepines we have prescribe gabapentin to help with this.  So far she has not noticed  much anxiety benefit from the gabapentin.  The alternative of gabapentin would take a longer period of adjustment and is unlikely to have any potential to help with depressive symptoms.  The paroxetine is being used for panic disorder but there could be consideration to increase it in hopes of helping with depression as long as she is on an adequate mood stabilizer.  There is risk of mood cycling with SSRIs and bipolar patients.  Increase gabapentin to 900 mg 3 times daily off label for anxiety and it may also help reduce her alcohol use.  She claims to have moderated  DC Xanax bc Alcohol abuse.  Discussed potential metabolic side effects associated with atypical antipsychotics, as well as potential risk for movement side effects. Advised pt to contact office if movement side effects occur.   For  treatment resistant bipolar depression consider switch to [facility] though she has had a partial response with Vraylar.  Statistically these are the options remaining that have the best odds of helping her bipolar depression. Consider ECT. Off label option pramipexole for TRD.   Continue Seroquel XR to 1200 mg daily for TR bipolar depression and reduction in anxiety.  Disc SE.  High dose for TR sx bc tolerating it well and limited options remain.  continue paroxetine 30 mg daily  DT mood lability with 40 mg daily  Shift work managed with Ronne Binning  Seeing therapist Jannet Askew and hasn't discussed traumatic event from the past yet.  Just realized this yesterday.  FU 4 weeks, if fails switch to Desma Mcgregor, MD, DFAPA   Please see After Visit Summary for patient specific instructions.  Future Appointments  Date Time Provider Department Center  03/27/2020  3:00 PM Cottle, Steva Ready., MD [facility] None    No orders of the defined types were placed in this encounter.   Future Appointments  Date Time Provider Department Center  03/27/2020  3:00 PM Cottle, Steva Ready., MD [facility] None    No orders of the defined types were placed in this encounter.     -------------------------------

## 2020-03-13 ENCOUNTER — Telehealth: Payer: Self-pay | Admitting: Psychiatry

## 2020-03-13 NOTE — Telephone Encounter (Signed)
Put her on cancellation list. °

## 2020-03-13 NOTE — Telephone Encounter (Signed)
Pt left a message that she is having panic attacks daily and would like some medicine to help with that. Pt has an appt on 03/27/20

## 2020-03-27 ENCOUNTER — Telehealth (INDEPENDENT_AMBULATORY_CARE_PROVIDER_SITE_OTHER): Payer: No Typology Code available for payment source | Admitting: Psychiatry

## 2020-03-27 ENCOUNTER — Encounter: Payer: Self-pay | Admitting: Psychiatry

## 2020-03-27 DIAGNOSIS — F4001 Agoraphobia with panic disorder: Secondary | ICD-10-CM | POA: Diagnosis not present

## 2020-03-27 DIAGNOSIS — G4726 Circadian rhythm sleep disorder, shift work type: Secondary | ICD-10-CM

## 2020-03-27 DIAGNOSIS — F411 Generalized anxiety disorder: Secondary | ICD-10-CM | POA: Diagnosis not present

## 2020-03-27 DIAGNOSIS — F314 Bipolar disorder, current episode depressed, severe, without psychotic features: Secondary | ICD-10-CM | POA: Diagnosis not present

## 2020-03-27 DIAGNOSIS — F5105 Insomnia due to other mental disorder: Secondary | ICD-10-CM | POA: Diagnosis not present

## 2020-03-27 NOTE — Progress Notes (Signed)
Zoe Ryan [id number] 04/23/1980 40 y.o.   Video Visit via My Chart  I connected with pt by My Chart and verified that I am speaking with the correct person using two identifiers.   I discussed the limitations, risks, security and privacy concerns of performing an evaluation and management service by My Chart  and the availability of in person appointments. I also discussed with the patient that there may be a patient responsible charge related to this service. The patient expressed understanding and agreed to proceed.  I discussed the assessment and treatment plan with the patient. The patient was provided an opportunity to ask questions and all were answered. The patient agreed with the plan and demonstrated an understanding of the instructions.   The patient was advised to call back or seek an in-person evaluation if the symptoms worsen or if the condition fails to improve as anticipated.  I provided 30 minutes of video time during this encounter.  The patient was located at home and the provider was located office. Started 145 until 215   Subjective:   Patient ID:  Zoe Ryan is a 40 y.o. (DOB 07/29/1980) female.  Chief Complaint:  Chief Complaint  Patient presents with  . Follow-up  . Anxiety  . Depression    Depression        Associated symptoms include no decreased concentration, no fatigue and no suicidal ideas.  Past medical history includes anxiety.   Anxiety Symptoms include nervous/anxious behavior. Patient reports no confusion, decreased concentration, dizziness, nausea, palpitations or suicidal ideas.       Zoe Ryan is seen again today urgently because of recent severe TR bipolar depressive and anxiety symptoms that have been interfering with her ability to work.  AT visit August 10, 2018 when for moderately severe anxiety she was instructed to increase sertraline  from 50 to 75 mg daily.  She's only been taking 50 sertraline bc forgot to increase it.  She was  also continued on the recently increased Vraylar 6 mg daily.    At visit August 2020.  Patient was encouraged to increase sertraline from 50 to 75 mg again for panic and anxiety.  She had not done so previously as suggested.  She was not improved as of the last visit. Increased sertraline from 50 to 75 for anxiety and panic.  Continued Wellbutrin 450, Vraylar 6, Xanax XR 1 mg TID, Nuvigil 250, Sonata HS.   visit October 19 and the following changes were made: Increase sertraline further to 100 mg in hopes of further reducing generalized anxiety disorder and depression.  The sertraline has blocked of the panic attacks.  It made a little difference but not that much.  Anxiety is worse than depression.   visit February 23, 2019.  She was taking sertraline 100 mg daily.  She felt the sertraline was causing some irritability.  We switched to paroxetine 20 mg.  Noticed a decrease in depression and anxiety and not as irritable with switch to paroxetine.  Tolerating well at HS. Daytime paroxetine dizzy.  Improvement plateau.  dep 4/10.  Anxiety 5/10.   Less work anxiety and a bit more satisfied overall.  Less tearful.   No mood swings.  Anxiety reduced to with work..  No recent panic.  A little more energy.  Still fairly Low motivation and interest.  Still Want to sleep and watch TV.  Stopped exercise bc motivation but trying to get better.  Poor productivity at home without change. 1 Missed days work  DT depression.  Functioning well at work. Intermittent FMLA .  Can struggle to maintain job interest but may be passing.  Been there 10 years.  No mood swings.  Can enjoy things. Good sleep.  Normally 8-9 hours sleep.  On weekend can sleep 10 hours then nap.  seen April 06, 2019.  The following change was made:  Increase paroxetine to 30 mg daily to further reduce anxiety and potentially depression .  She remained on Vraylar 6 mg, Wellbutrin XL 450,  Nuvigil 250 mg, Xanax XR 1 mg 3 times daily. She called back  March 11 complaining of feeling numb all the time as well as depression and weight gain.  It was affecting work.  We will put her on the cancellation list to try to get an appointment as soon as possible as there was not an obvious med adjustment that could be made outside of an appointment. She cut back the paroxetine to 20 mg bc gain 15#, feeling numb, and trouble concentrating about March 11.   07/11/2019 appointment with the following noted: Doing better overall with SE paroxetine.  A little more focus.  Feeling less numb.  Dep 5/10.  Anxiety 6/10.  Not much progress over the last couple of mos.  Still low interest, enjoyment, motivation.  Missed 1 day in last month but running late and leaving early.  No panic.  Thinks paroxetine helps anxiety some.  Sticking with 3 daily of Xanax XR 1 mg.  Xanax 0.5 about 7 times per week for sleep. Sleep good with more disciplined schedule helped. 8 hours.  If can will sleep a lot.  No NM. Plan:Increase Seroquel XR to 300 mg daily for TR bipolar depression.  Disc SE.   Try to wean HS Xanax.  08/16/2019 appointment with the following noted: No additional improvement with increase Seroquel but no addional SE either. Dep & anxiety 6-7/10 worse than last time. Work function reduced and had to leave early a couple of days.  Couple of panic before work. Sleep variable from normal to excessive.  Started a new diet with restrictive calories and reinjured knee only sig stressors. Plan: For treatment resistant bipolar depression [facility] though she has had a partial response with Vraylar.  Statistically these are the options remaining that have the best odds of helping her bipolar depression. Consider ECT. Off label option pramipexole for TRD.  Increase Seroquel XR to 600 mg daily for TR bipolar depression.  Disc SE.   Try to wean HS Xanax.  Call before July 1 if no improvement and we'll consider increasing the Seroquel XR to 800 mg daily.  09/24/2019  phone call with patient requesting early refill of Xanax saying she had been very stressed out and had taken more than prescribed MD response:  She is already on a high dose of Xanax and exceeding the dosage Rx and running out a month early is entirely unacceptable and puts her at risk of seizures.  She is getting 2 different forms of Xanax.  PDMP shows: 07/24/2019  2   07/24/2019  Alprazolam 0.5 MG Tablet  90.00  90 Ca Cot   [id number]   Nor ([id number])   0/0  1.00 LME  Comm Ins   [place]  07/24/2019  2   07/24/2019  Alprazolam Xr 1 MG Tablet  270.00  90           If she cannot comply I will take her off the med.  To more closely  monitor her compliance I will RX it to her 1 week at a time until we can establish her ability to comply. I sent in 1 week supply of each.  11/07/2019 appointment with the following noted: Never ran out of Xanax but feared it.  Was highly anxious anticipating surgery 3-4 weeks ago.  Went well  No complications and getting back to normal work now.  At work last week. Increase Seroquel XR to 600 mg nightly.  Was in a lot of pain at the time and hard to say the effect.   Depression 4/10, anxiety 6/10.  Sleeping well.  8 hours.  More on weekends.  No SE. Has panic at least 2 / week and usually triggered by work or social unfamiliar places.   No mania nor mood swings or agitation.  Work is best function in a long time. Plan: Increase Seroquel XR to 800 mg daily for TR bipolar depression and reduction in anxiety.  Disc SE.   Increase paroxetine 40 mg daily for panic and anxiety bc poor control  01/07/2020 appointment with the following noted: Had 2-3 happy days but otherwise not much change.  Was quite sick last week which was horrible.Zoe Ryan. Wonders about dx criteria for PTSD.  Had a rough time 15 years ago without meds.  Lived in house with friends and it ballooned into weird gaslighting of people doing things behind her back.  Was depressed and self medicating with alcohol.  If get  this slight feeling or rejection then all the old hurt comes back.  This might be why haven't made new friends since that incident.  Sounds benign but they really messed with my head.  Made me sound so paranoid and delusional which reinforced their idea she wasn't welcome anymore.  Still getting triggered by silly stuff like that.  Anything that reminds me of that makes it hard to function.  No SE.  Sleeping long bc recently sick and before that was 8-10 hours. 1 panic since here on Monday morning. depression 6/10 and anxiety 4/10. Occ crying spells.  Sometimes interest.   Gained weight over time since broken leg and change in job or marriage.  In weight management program now.  Plan: Increase Seroquel XR to 1200 mg daily for TR bipolar depression and reduction in anxiety.  Disc SE.  High dose for TR sx bc tolerating it well and limited options remain. Consider Increase but continue for now paroxetine 40 mg daily for panic and anxiety bc poor control  01/24/20 note: [facility] with recommended taper therafter. Started Optifast plan in May 2021. Follow knee surgery, states she relapsed EtOH use. As above, now having 4-5 drinks per night.   BC of this we are limiting Xanax.  But will not stop yet DT risk withdrawal problems is great.  She has appt here soon.  Will put her on cancellation list.  01/30/20 appt with following noted: Pt now saying she's only drinking 1-2 drinks nightly.  Admits relapse into alcohol for about a year.  Doesn't think she's ready to stop drinking. Never had any alcohol treatment.  In past drug problems, marijuana and hallucinogens.  Denies opiate abuse.   Pat FHx D&A abuse.  Seeing therapist every other week. No noticeable change with med changes. Mood labillity with crying at one point and couple hours later laughing and feeling like a grand old time without reason.  Most days labile.  Probably  worse with increase paroxetine to 40 mg daily. No SE with meds.   No SI but did have self harm thoughts like cutting with knife when feeling down.   Last cutting a decade ago. More anxious. And more panic Plan: Continue Seroquel XR to 1200 mg daily for TR bipolar depression and reduction in anxiety.  Disc SE.  High dose for TR sx bc tolerating it well and limited options remain. Reduce paroxetine 30 mg daily  DT mood lability with 40 mg daily and hopefully still help anxiety Start gabapentin 300 mg TID for anxiety off label and help ease WD from Xanax and it might help with alcohol abuse.  Increase gabapentin as tolerated up to 600 mg TID.  DC Xanax bc Alcohol abuse.  02/18/2020 appointment with the following noted: Going OK.  Not a lot of change since here.  Been out of town for 10 days so hard to say.  Gabapentin not that helpful at 600 TID.  No SE. Not as much mood swings.  Depression is not severe 5/10.  Anxiety is 7.5/10. Tolerating meds. Has cut back alcohol to 1-2 beers daily.  Replacing with diet soda or sparkling water.   No cutting or self harm.    Still benefitting from Nuvigil.  Plan: Increase gabapentin to 900 mg 3 times daily off label for anxiety and it may also help reduce her alcohol use.  She claims to have moderated DC Xanax bc Alcohol abuse.  Florentina Addison T   SW  03/13/20 3:46 PM Note Pt left a message that she is having panic attacks daily and would like some medicine to help with that. Pt has an appt on 03/27/20     03/27/2020 appointment with the following noted: Compliant with meds.  No anxiety benefit with higher dose gabapentin. Crazy mood swings and bad irritability.  Never this irritable in her life at everyone.  Got worse about a month ago. No change in sleep pattern.  Poor energy.  Uncomfortable nervous energy.  Denies increase speech or spending.  Working.  Trouble finishing projects and motivation problems.  Panic at least 3 times a week before she goes  in to work.  Not really racing thoughts.  Can feel a little paranoid like people are out to get her and doesn't remember that before. 7 hours sleep.  No SI. Moderate depression.   In March 2020 her diagnosis was changed to bipolar depression.    Failed psychiatric medications include:  Lexapro,  paroxetine 40 mood lability,  sertraline 100 more irritable,  fluoxetine, venlafaxine, Wellbutrin 450, duloxetine 120  ,  Failed to respond to lexapro plus Rexulti lithium,  lamotrigine doesn't remember why she stopped, valproic acid, Abilify, lithium, Rexulti,  She says the propranolol is not very helpful for anxiety, but taken it once or twice for tremor with anxiety. Had some mood improvement in stability with increase Vraylar to 6. Seroquel XR 600 mg daily. Ambien amnesia  Review of Systems:  Review of Systems  Constitutional: Negative for fatigue.  Cardiovascular: Negative for palpitations.  Gastrointestinal: Negative for diarrhea and nausea.  Genitourinary:       Stress diarrhea resolved    Musculoskeletal: Positive for arthralgias and joint swelling.  Neurological: Positive for tremors. Negative for dizziness and weakness.  Psychiatric/Behavioral: Positive for depression and dysphoric mood. Negative for agitation, behavioral problems, confusion, decreased concentration, hallucinations, self-injury, sleep disturbance and suicidal ideas. The patient is nervous/anxious. The patient is not hyperactive.     Medications:  I have reviewed the patient's current medications.  Current Outpatient Medications  Medication Sig Dispense Refill  . Armodafinil 250 MG tablet Take 1 tablet (250 mg total) by mouth daily. 90 tablet 0  . etonogestrel (NEXPLANON) 68 MG IMPL implant 1 each by Subdermal route once.    . gabapentin (NEURONTIN) 300 MG capsule Take 3 capsules (900 mg total) by mouth 3 (three) times daily. 270 capsule 1  . Multiple Vitamin (MULITIVITAMIN WITH MINERALS) TABS Take 1 tablet by  mouth daily.    Marland Kitchen PARoxetine (PAXIL) 20 MG tablet Take 1.5 tablets (30 mg total) by mouth daily. 135 tablet 0  . QUEtiapine (SEROQUEL XR) 400 MG 24 hr tablet Take 3 tablets (1,200 mg total) by mouth at bedtime. 90 tablet 1  . zaleplon (SONATA) 10 MG capsule Take 1 capsule (10 mg total) by mouth at bedtime. 30 capsule 0   No current facility-administered medications for this visit.    Medication Side Effects: None  Allergies: No Known Allergies  Past Medical History:  Diagnosis Date  . Anxiety   . Chronic shoulder pain   . Depression   . History of tobacco use    Quit 2013    Family History  Problem Relation Age of Onset  . Anxiety disorder Maternal Grandmother   . Mental retardation Maternal Grandmother   . Congestive Heart Failure Maternal Grandfather   . Cancer Maternal Grandfather        lymphoma  . Stroke Paternal Grandfather   . Mental illness Brother        depression  . Hyperlipidemia Brother     Social History   Socioeconomic History  . Marital status: Divorced    Spouse name: [name]  . Number of children: 0  . Years of education: College  . Highest education level: Not on file  Occupational History  . Occupation: Building control surveyor: [employer]  Tobacco Use  . Smoking status: Former Games developer  . Smokeless tobacco: Never Used  Vaping Use  . Vaping Use: Every day  Substance and Sexual Activity  . Alcohol use: Yes    Alcohol/week: 11.0 standard drinks    Types: 11 Cans of beer per week  . Drug use: No  . Sexual activity: Never    Partners: Male    Comment: NuvaRing  Other Topics Concern  . Not on file  Social History Narrative   Lives with her husband.     Education: [employer]   Exercise: Yes   Social Determinants of Corporate investment banker Strain: Not on file  Food Insecurity: Not on file  Transportation Needs: Not on file  Physical Activity: Not on file  Stress: Not on file  Social Connections: Not on file  Intimate Partner  Violence: Not on file    Past Medical History, Surgical history, Social history, and Family history were reviewed and updated as appropriate.   Please see review of systems for further details on the patient's review from today.   Objective:   Physical Exam:  There were no vitals taken for this visit.  Physical Exam Neurological:     Mental Status: She is alert and oriented to person, place, and time.     Cranial Nerves: No dysarthria.  Psychiatric:        Attention and Perception: Attention normal.        Mood and Affect: Mood is anxious and depressed. Affect is not tearful.        Speech: Speech  normal. Speech is not slurred.        Behavior: Behavior is cooperative.        Thought Content: Thought content normal. Thought content is not paranoid or delusional. Thought content does not include homicidal or suicidal ideation. Thought content does not include homicidal or suicidal plan.        Cognition and Memory: Cognition and memory normal.        Judgment: Judgment normal.     Comments: Not HYpoverbal. Insight ok More irritable Anxiety not managed      Review of Systems:  Review of Systems  Constitutional: Negative for fatigue.  Gastrointestinal: Negative for diarrhea.  Genitourinary:       Stress diarrhea resolved    Neurological: Negative for dizziness, tremors and weakness.  Psychiatric/Behavioral: Positive for depression and dysphoric mood. Negative for agitation, behavioral problems, confusion, decreased concentration, hallucinations, self-injury, sleep disturbance and suicidal ideas. The patient is nervous/anxious. The patient is not hyperactive.     Medications: I have reviewed the patient's current medications.  Current Outpatient Medications  Medication Sig Dispense Refill  . Armodafinil 250 MG tablet Take 1 tablet (250 mg total) by mouth daily. 90 tablet 0  . etonogestrel (NEXPLANON) 68 MG IMPL implant 1 each by Subdermal route once.    . gabapentin  (NEURONTIN) 300 MG capsule Take 3 capsules (900 mg total) by mouth 3 (three) times daily. 270 capsule 1  . Multiple Vitamin (MULITIVITAMIN WITH MINERALS) TABS Take 1 tablet by mouth daily.    Marland Kitchen PARoxetine (PAXIL) 20 MG tablet Take 1.5 tablets (30 mg total) by mouth daily. 135 tablet 0  . QUEtiapine (SEROQUEL XR) 400 MG 24 hr tablet Take 3 tablets (1,200 mg total) by mouth at bedtime. 90 tablet 1  . zaleplon (SONATA) 10 MG capsule Take 1 capsule (10 mg total) by mouth at bedtime. 30 capsule 0   No current facility-administered medications for this visit.    Medication Side Effects: dry mouth  Allergies: No Known Allergies  Past Medical History:  Diagnosis Date  . Anxiety   . Chronic shoulder pain   . Depression   . History of tobacco use    Quit 2013    Family History  Problem Relation Age of Onset  . Anxiety disorder Maternal Grandmother   . Mental retardation Maternal Grandmother   . Congestive Heart Failure Maternal Grandfather   . Cancer Maternal Grandfather        lymphoma  . Stroke Paternal Grandfather   . Mental illness Brother        depression  . Hyperlipidemia Brother     Social History   Socioeconomic History  . Marital status: Divorced    Spouse name: [name]  . Number of children: 0  . Years of education: College  . Highest education level: Not on file  Occupational History  . Occupation: Building control surveyor: [employer]  Tobacco Use  . Smoking status: Former Games developer  . Smokeless tobacco: Never Used  Vaping Use  . Vaping Use: Every day  Substance and Sexual Activity  . Alcohol use: Yes    Alcohol/week: 11.0 standard drinks    Types: 11 Cans of beer per week  . Drug use: No  . Sexual activity: Never    Partners: Male    Comment: NuvaRing  Other Topics Concern  . Not on file  Social History Narrative   Lives with her husband.     Education: College   Exercise: Yes  Social Determinants of Health   Financial Resource Strain: Not on file   Food Insecurity: Not on file  Transportation Needs: Not on file  Physical Activity: Not on file  Stress: Not on file  Social Connections: Not on file  Intimate Partner Violence: Not on file    Past Medical History, Surgical history, Social history, and Family history were reviewed and updated as appropriate.   Please see review of systems for further details on the patient's review from today.   Objective:   Physical Exam:  There were no vitals taken for this visit.  Physical Exam Neurological:     Mental Status: She is alert and oriented to person, place, and time.     Cranial Nerves: No dysarthria.  Psychiatric:        Attention and Perception: Attention normal.        Mood and Affect: Mood is anxious and depressed. Affect is not tearful.        Speech: Speech normal.        Behavior: Behavior is cooperative.        Thought Content: Thought content normal. Thought content is not paranoid or delusional. Thought content does not include homicidal or suicidal ideation. Thought content does not include homicidal or suicidal plan.        Cognition and Memory: Cognition and memory normal.        Judgment: Judgment normal.     Comments: HYpoverbal. Insight ok     Lab Review:     Component Value Date/Time   NA 137 01/04/2017 1420   K 4.0 01/04/2017 1420   CL 98 01/04/2017 1420   CO2 23 01/04/2017 1420   GLUCOSE 86 01/04/2017 1420   GLUCOSE 76 07/08/2015 1440   BUN 6 01/04/2017 1420   CREATININE 0.71 01/04/2017 1420   CREATININE 0.66 07/08/2015 1440   CALCIUM 9.3 01/04/2017 1420   PROT 7.9 01/04/2017 1420   ALBUMIN 3.9 01/04/2017 1420   AST 22 01/04/2017 1420   ALT 26 01/04/2017 1420   ALKPHOS 99 01/04/2017 1420   BILITOT 0.2 01/04/2017 1420   GFRNONAA 111 01/04/2017 1420   GFRAA 128 01/04/2017 1420       Component Value Date/Time   WBC 7.7 01/04/2017 1420   WBC 6.4 07/08/2015 1440   RBC 4.48 01/04/2017 1420   RBC 4.38 07/08/2015 1440   HGB 14.3 01/04/2017  1420   HCT 40.9 01/04/2017 1420   PLT 363 01/04/2017 1420   MCV 91 01/04/2017 1420   MCH 31.9 01/04/2017 1420   MCH 32.4 07/08/2015 1440   MCHC 35.0 01/04/2017 1420   MCHC 34.4 07/08/2015 1440   RDW 14.3 01/04/2017 1420   LYMPHSABS 3.0 01/04/2017 1420   MONOABS 384 07/08/2015 1440   EOSABS 0.0 01/04/2017 1420   BASOSABS 0.0 01/04/2017 1420    No results found for: POCLITH, LITHIUM   No results found for: PHENYTOIN, PHENOBARB, VALPROATE, CBMZ   .res Assessment: Plan:    Zoe Ryan was seen today for follow-up, anxiety and depression.  Diagnoses and all orders for this visit:  Severe bipolar I disorder, current or most recent episode depressed (HCC)  Panic disorder with agoraphobia  Generalized anxiety disorder  Insomnia due to mental condition  Shift work sleep disorder   Trenda had her diagnosis changed from major depression to bipolar depression in March 2021.   Her depression and anxiety had been treatment resistant.  Her depression, panic and generalized anxiety disorder are all chronic.  It was  discovered from a note with bariatric services at [facility] in late 2021 that the patient's drinking and admitted 4-5 drinks per night.  She admits now that she had relapsed on alcohol more significantly about a year ago and had been underreporting the amount she was drinking.  Discussed at length that this could be interfering with the effectiveness of her treatment of depression and anxiety.  However she states she is not willing to stop drinking at this time.  Encouraged her to discuss these issues with her therapist as well which she indicated she would.  Discussed the risk especially of benzodiazepine use with alcohol and we would need to stop the benzodiazepines.  Discussed the risk of withdrawal coming off the benzodiazepines we have prescribe gabapentin to help with this. She reports being successful at reduction to 2 drinks per night.  So far she has not noticed much anxiety  benefit from the gabapentin at 900 mg TID.   The alternative of gabapentin would take a longer period of adjustment and is unlikely to have any potential to help with depressive symptoms.  We are going to continue the gabapentin because the irritability caused by paroxetine may be interfering with the benefit potential of gabapentin.  If we decide later it is not helping then we will discontinue it.  The paroxetine appears to be causing marked irritability and mood lability.  It did this at the higher dose of 40 mg as well but then seemed to get better with dosage reduction.  It is not helping adequately for anxiety or depression apparently either so we are going to wean paroxetine.  Discussed SSRI withdrawal.  She will wean paroxetine over 2 weeks and call if she has withdrawal symptoms.    Continue gabapentin to 900 mg 3 times daily off label for anxiety and it may also help reduce her alcohol use.  She claims to have moderated  No Xanax bc Alcohol abuse.  Discussed potential metabolic side effects associated with atypical antipsychotics, as well as potential risk for movement side effects. Advised pt to contact office if movement side effects occur.   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   Statistically these are the options remaining that have the best odds of helping her bipolar depression. Consider ECT. Off label option pramipexole for TRD.   Continue Seroquel XR to 1200 mg daily for TR bipolar depression and reduction in anxiety.  Disc SE.  High dose for TR sx bc tolerating it well and limited options remain.  Wean paroxetine DT mood lability and irritabiliity.   Disc SE in detail and SSRI withdrawal sx.   Shift work managed with Ronne Binning  Seeing therapist Jannet Askew and hasn't discussed traumatic event from the past yet.  Just realized this yesterday.  FU 3 weeks, if fails switch to Desma Mcgregor, MD, DFAPA   Please see After Visit Summary  for patient specific instructions.  No future appointments.  No orders of the defined types were placed in this encounter.   No future appointments.  No orders of the defined types were placed in this encounter.     -------------------------------

## 2020-04-02 ENCOUNTER — Other Ambulatory Visit: Payer: Self-pay | Admitting: Psychiatry

## 2020-04-02 DIAGNOSIS — F411 Generalized anxiety disorder: Secondary | ICD-10-CM

## 2020-04-02 DIAGNOSIS — G4726 Circadian rhythm sleep disorder, shift work type: Secondary | ICD-10-CM

## 2020-04-02 DIAGNOSIS — F314 Bipolar disorder, current episode depressed, severe, without psychotic features: Secondary | ICD-10-CM

## 2020-04-02 DIAGNOSIS — F5105 Insomnia due to other mental disorder: Secondary | ICD-10-CM

## 2020-04-02 NOTE — Telephone Encounter (Signed)
Next apt 04/25/20

## 2020-04-16 ENCOUNTER — Other Ambulatory Visit: Payer: Self-pay | Admitting: Psychiatry

## 2020-04-16 DIAGNOSIS — F314 Bipolar disorder, current episode depressed, severe, without psychotic features: Secondary | ICD-10-CM

## 2020-04-16 DIAGNOSIS — F411 Generalized anxiety disorder: Secondary | ICD-10-CM

## 2020-04-16 DIAGNOSIS — F5105 Insomnia due to other mental disorder: Secondary | ICD-10-CM

## 2020-04-18 ENCOUNTER — Other Ambulatory Visit: Payer: Self-pay | Admitting: Psychiatry

## 2020-04-18 DIAGNOSIS — F10220 Alcohol dependence with intoxication, uncomplicated: Secondary | ICD-10-CM

## 2020-04-18 DIAGNOSIS — F4001 Agoraphobia with panic disorder: Secondary | ICD-10-CM

## 2020-04-25 ENCOUNTER — Encounter: Payer: Self-pay | Admitting: Psychiatry

## 2020-04-25 ENCOUNTER — Telehealth (INDEPENDENT_AMBULATORY_CARE_PROVIDER_SITE_OTHER): Payer: No Typology Code available for payment source | Admitting: Psychiatry

## 2020-04-25 DIAGNOSIS — F411 Generalized anxiety disorder: Secondary | ICD-10-CM | POA: Diagnosis not present

## 2020-04-25 DIAGNOSIS — F5105 Insomnia due to other mental disorder: Secondary | ICD-10-CM | POA: Diagnosis not present

## 2020-04-25 DIAGNOSIS — F4001 Agoraphobia with panic disorder: Secondary | ICD-10-CM | POA: Diagnosis not present

## 2020-04-25 DIAGNOSIS — G4726 Circadian rhythm sleep disorder, shift work type: Secondary | ICD-10-CM

## 2020-04-25 DIAGNOSIS — F10220 Alcohol dependence with intoxication, uncomplicated: Secondary | ICD-10-CM

## 2020-04-25 DIAGNOSIS — F314 Bipolar disorder, current episode depressed, severe, without psychotic features: Secondary | ICD-10-CM

## 2020-04-25 NOTE — Progress Notes (Signed)
Zoe Ryan [id number] Jul 11, 1980 40 y.o.   Video Visit via My Chart  I connected with pt by My Chart and verified that I am speaking with the correct person using two identifiers.   I discussed the limitations, risks, security and privacy concerns of performing an evaluation and management service by My Chart  and the availability of in person appointments. I also discussed with the patient that there may be a patient responsible charge related to this service. The patient expressed understanding and agreed to proceed.  I discussed the assessment and treatment plan with the patient. The patient was provided an opportunity to ask questions and all were answered. The patient agreed with the plan and demonstrated an understanding of the instructions.   The patient was advised to call back or seek an in-person evaluation if the symptoms worsen or if the condition fails to improve as anticipated.  I provided 30 minutes of video time during this encounter.  The patient was located at home and the provider was located office. Started 1030-1100   Subjective:   Patient ID:  Zoe Ryan is a 40 y.o. (DOB Oct 17, 1980) female.  Chief Complaint:  Chief Complaint  Patient presents with  . Follow-up  . Severe bipolar I disorder, current or most recent episode d  . Depression  . Anxiety    Depression        Associated symptoms include no decreased concentration, no fatigue and no suicidal ideas.  Past medical history includes anxiety.   Anxiety Symptoms include nervous/anxious behavior. Patient reports no confusion, decreased concentration, dizziness, nausea, palpitations or suicidal ideas.       Zoe Ryan is seen again today urgently because of recent severe TR bipolar depressive and anxiety symptoms that have been interfering with her ability to work.  AT visit August 10, 2018 when for moderately severe anxiety she was instructed to increase sertraline  from 50 to 75 mg daily.  She's only  been taking 50 sertraline bc forgot to increase it.  She was also continued on the recently increased Vraylar 6 mg daily.    At visit August 2020.  Patient was encouraged to increase sertraline from 50 to 75 mg again for panic and anxiety.  She had not done so previously as suggested.  She was not improved as of the last visit. Increased sertraline from 50 to 75 for anxiety and panic.  Continued Wellbutrin 450, Vraylar 6, Xanax XR 1 mg TID, Nuvigil 250, Sonata HS.   visit October 19 and the following changes were made: Increase sertraline further to 100 mg in hopes of further reducing generalized anxiety disorder and depression.  The sertraline has blocked of the panic attacks.  It made a little difference but not that much.  Anxiety is worse than depression.   visit February 23, 2019.  She was taking sertraline 100 mg daily.  She felt the sertraline was causing some irritability.  We switched to paroxetine 20 mg.  Noticed a decrease in depression and anxiety and not as irritable with switch to paroxetine.  Tolerating well at HS. Daytime paroxetine dizzy.  Improvement plateau.  dep 4/10.  Anxiety 5/10.   Less work anxiety and a bit more satisfied overall.  Less tearful.   No mood swings.  Anxiety reduced to with work..  No recent panic.  A little more energy.  Still fairly Low motivation and interest.  Still Want to sleep and watch TV.  Stopped exercise bc motivation but trying to get better.  Poor productivity at home without change. 1 Missed days work DT depression.  Functioning well at work. Intermittent FMLA .  Can struggle to maintain job interest but may be passing.  Been there 10 years.  No mood swings.  Can enjoy things. Good sleep.  Normally 8-9 hours sleep.  On weekend can sleep 10 hours then nap.  seen April 06, 2019.  The following change was made:  Increase paroxetine to 30 mg daily to further reduce anxiety and potentially depression .  She remained on Vraylar 6 mg, Wellbutrin XL 450,   Nuvigil 250 mg, Xanax XR 1 mg 3 times daily. She called back March 11 complaining of feeling numb all the time as well as depression and weight gain.  It was affecting work.  We will put her on the cancellation list to try to get an appointment as soon as possible as there was not an obvious med adjustment that could be made outside of an appointment. She cut back the paroxetine to 20 mg bc gain 15#, feeling numb, and trouble concentrating about March 11.   07/11/2019 appointment with the following noted: Doing better overall with SE paroxetine.  A little more focus.  Feeling less numb.  Dep 5/10.  Anxiety 6/10.  Not much progress over the last couple of mos.  Still low interest, enjoyment, motivation.  Missed 1 day in last month but running late and leaving early.  No panic.  Thinks paroxetine helps anxiety some.  Sticking with 3 daily of Xanax XR 1 mg.  Xanax 0.5 about 7 times per week for sleep. Sleep good with more disciplined schedule helped. 8 hours.  If can will sleep a lot.  No NM. Plan:Increase Seroquel XR to 300 mg daily for TR bipolar depression.  Disc SE.   Try to wean HS Xanax.  08/16/2019 appointment with the following noted: No additional improvement with increase Seroquel but no addional SE either. Dep & anxiety 6-7/10 worse than last time. Work function reduced and had to leave early a couple of days.  Couple of panic before work. Sleep variable from normal to excessive.  Started a new diet with restrictive calories and reinjured knee only sig stressors. Plan: For treatment resistant bipolar depression consider switch to [facility] though she has had a partial response with Vraylar.  Statistically these are the options remaining that have the best odds of helping her bipolar depression. Consider ECT. Off label option pramipexole for TRD.  Increase Seroquel XR to 600 mg daily for TR bipolar depression.  Disc SE.   Try to wean HS Xanax.  Call before July 1 if no improvement and we'll  consider increasing the Seroquel XR to 800 mg daily.  09/24/2019 phone call with patient requesting early refill of Xanax saying she had been very stressed out and had taken more than prescribed MD response:  She is already on a high dose of Xanax and exceeding the dosage Rx and running out a month early is entirely unacceptable and puts her at risk of seizures.  She is getting 2 different forms of Xanax.  PDMP shows: 07/24/2019  2   07/24/2019  Alprazolam 0.5 MG Tablet  90.00  90 Ca Cot   [id number]   Nor ([id number])   0/0  1.00 LME  Comm Ins   [employer]  07/24/2019  2   07/24/2019  Alprazolam Xr 1 MG Tablet  270.00  90           If she cannot comply I  will take her off the med.  To more closely monitor her compliance I will RX it to her 1 week at a time until we can establish her ability to comply. I sent in 1 week supply of each.  11/07/2019 appointment with the following noted: Never ran out of Xanax but feared it.  Was highly anxious anticipating surgery 3-4 weeks ago.  Went well  No complications and getting back to normal work now.  At work last week. Increase Seroquel XR to 600 mg nightly.  Was in a lot of pain at the time and hard to say the effect.   Depression 4/10, anxiety 6/10.  Sleeping well.  8 hours.  More on weekends.  No SE. Has panic at least 2 / week and usually triggered by work or social unfamiliar places.   No mania nor mood swings or agitation.  Work is best function in a long time. Plan: Increase Seroquel XR to 800 mg daily for TR bipolar depression and reduction in anxiety.  Disc SE.   Increase paroxetine 40 mg daily for panic and anxiety bc poor control  01/07/2020 appointment with the following noted: Had 2-3 happy days but otherwise not much change.  Was quite sick last week which was horrible.Johnnette Barrios about dx criteria for PTSD.  Had a rough time 15 years ago without meds.  Lived in house with friends and it ballooned into weird gaslighting of people doing things behind  her back.  Was depressed and self medicating with alcohol.  If get this slight feeling or rejection then all the old hurt comes back.  This might be why haven't made new friends since that incident.  Sounds benign but they really messed with my head.  Made me sound so paranoid and delusional which reinforced their idea she wasn't welcome anymore.  Still getting triggered by silly stuff like that.  Anything that reminds me of that makes it hard to function.  No SE.  Sleeping long bc recently sick and before that was 8-10 hours. 1 panic since here on Monday morning. depression 6/10 and anxiety 4/10. Occ crying spells.  Sometimes interest.   Gained weight over time since broken leg and change in job or marriage.  In weight management program now.  Plan: Increase Seroquel XR to 1200 mg daily for TR bipolar depression and reduction in anxiety.  Disc SE.  High dose for TR sx bc tolerating it well and limited options remain. Consider Increase but continue for now paroxetine 40 mg daily for panic and anxiety bc poor control  01/24/20 note: 01/07/20 note by [facility] Heavy EtOH use prior to [facility] on April 2021 with recommended taper therafter. Started Optifast plan in May 2021. Follow knee surgery, states she relapsed EtOH use. As above, now having 4-5 drinks per night.   BC of this we are limiting Xanax.  But will not stop yet DT risk withdrawal problems is great.  She has appt here soon.  Will put her on cancellation list.  01/30/20 appt with following noted: Pt now saying she's only drinking 1-2 drinks nightly.  Admits relapse into alcohol for about a year.  Doesn't think she's ready to stop drinking. Never had any alcohol treatment.  In past drug problems, marijuana and hallucinogens.  Denies opiate abuse.   Pat FHx D&A abuse.  Seeing therapist every other week. No noticeable change with med changes. Mood labillity with crying at one point and couple hours later laughing and feeling  like a grand old time without reason.  Most days labile. Probably worse with increase paroxetine to 40 mg daily. No SE with meds.   No SI but did have self harm thoughts like cutting with knife when feeling down.   Last cutting a decade ago. More anxious. And more panic Plan: Continue Seroquel XR to 1200 mg daily for TR bipolar depression and reduction in anxiety.  Disc SE.  High dose for TR sx bc tolerating it well and limited options remain. Reduce paroxetine 30 mg daily  DT mood lability with 40 mg daily and hopefully still help anxiety Start gabapentin 300 mg TID for anxiety off label and help ease WD from Xanax and it might help with alcohol abuse.  Increase gabapentin as tolerated up to 600 mg TID.  DC Xanax bc Alcohol abuse.  02/18/2020 appointment with the following noted: Going OK.  Not a lot of change since here.  Been out of town for 10 days so hard to say.  Gabapentin not that helpful at 600 TID.  No SE. Not as much mood swings.  Depression is not severe 5/10.  Anxiety is 7.5/10. Tolerating meds. Has cut back alcohol to 1-2 beers daily.  Replacing with diet soda or sparkling water.   No cutting or self harm.    Still benefitting from Nuvigil.  Plan: Increase gabapentin to 900 mg 3 times daily off label for anxiety and it may also help reduce her alcohol use.  She claims to have moderated DC Xanax bc Alcohol abuse.  Florentina Addison T   SW  03/13/20 3:46 PM Note Pt left a message that she is having panic attacks daily and would like some medicine to help with that. Pt has an appt on 03/27/20     03/27/2020 appointment with the following noted: Compliant with meds.  No anxiety benefit with higher dose gabapentin. Crazy mood swings and bad irritability.  Never this irritable in her life at everyone.  Got worse about a month ago. No change in sleep pattern.  Poor energy.  Uncomfortable nervous energy.  Denies increase speech or spending.  Working.  Trouble finishing projects and  motivation problems.  Panic at least 3 times a week before she goes in to work.  Not really racing thoughts.  Can feel a little paranoid like people are out to get her and doesn't remember that before. 7 hours sleep.  No SI. Moderate depression. Plan: Wean paroxetine DT mood lability and irritabiliity.   Continue gabapentin to 900 mg 3 times  Continue high-dose quetiapine XR 1200 mg daily for a longer trial.  04/25/2020 appointment with following noted: Weaned off paroxetine which was a little rough. Irritability is no better and maybe worse and crying spells are worse.  Been off about 2 weeks.    Depression 7/10.  Anxiety 7/10.  Reasons to be anxious: surgery knee next week.  Out of work for 4 weeks.    Will be $ stress bc only a couple of days PTO.  Rare missed work with crying.  Having panic attacks 1-2 times a day before or during work.  Trying to work.  Short-staffed and a lot on her plate with work. Really well with alcohol overall.  Only a couple of nights per week and a couple of drinks.   In March 2020 her diagnosis was changed to bipolar depression.    Failed psychiatric medications include:  Lexapro,  paroxetine 40 mood lability,  sertraline 100 more irritable,  fluoxetine, venlafaxine,  Wellbutrin 450, duloxetine 120  ,  Failed to respond to lexapro plus Rexulti lithium,  lamotrigine doesn't remember why she stopped, valproic acid,  Abilify, Rexulti, Seroquel XR 1200 mg daily. Had some mood improvement in stability with increase Vraylar to 6. She says the propranolol is not very helpful for anxiety, but taken it once or twice for tremor with anxiety. Ambien amnesia  Review of Systems:  Review of Systems  Constitutional: Negative for fatigue.  Cardiovascular: Negative for palpitations.  Gastrointestinal: Negative for diarrhea and nausea.  Genitourinary:       Stress diarrhea resolved    Musculoskeletal: Positive for arthralgias and joint swelling.  Neurological: Negative  for dizziness, tremors and weakness.  Psychiatric/Behavioral: Positive for depression and dysphoric mood. Negative for agitation, behavioral problems, confusion, decreased concentration, hallucinations, self-injury, sleep disturbance and suicidal ideas. The patient is nervous/anxious. The patient is not hyperactive.     Medications: I have reviewed the patient's current medications.  Current Outpatient Medications  Medication Sig Dispense Refill  . Armodafinil 250 MG tablet Take 1 tablet (250 mg total) by mouth daily. 90 tablet 0  . etonogestrel (NEXPLANON) 68 MG IMPL implant 1 each by Subdermal route once.    . gabapentin (NEURONTIN) 300 MG capsule Take 3 capsules (900 mg total) by mouth 3 (three) times daily. 270 capsule 0  . Multiple Vitamin (MULITIVITAMIN WITH MINERALS) TABS Take 1 tablet by mouth daily.    . QUEtiapine (SEROQUEL XR) 400 MG 24 hr tablet Take 3 tablets (1,200 mg total) by mouth at bedtime. 90 tablet 1  . zaleplon (SONATA) 10 MG capsule Take 1 capsule (10 mg total) by mouth at bedtime. 30 capsule 0  . PARoxetine (PAXIL) 20 MG tablet Take 1.5 tablets (30 mg total) by mouth daily. (Patient not taking: Reported on 04/25/2020) 135 tablet 0   No current facility-administered medications for this visit.    Medication Side Effects: None  Allergies: No Known Allergies  Past Medical History:  Diagnosis Date  . Anxiety   . Chronic shoulder pain   . Depression   . History of tobacco use    Quit 2013    Family History  Problem Relation Age of Onset  . Anxiety disorder Maternal Grandmother   . Mental retardation Maternal Grandmother   . Congestive Heart Failure Maternal Grandfather   . Cancer Maternal Grandfather        lymphoma  . Stroke Paternal Grandfather   . Mental illness Brother        depression  . Hyperlipidemia Brother     Social History   Socioeconomic History  . Marital status: Divorced    Spouse name: [name]  . Number of children: 0  . Years of  education: College  . Highest education level: Not on file  Occupational History  . Occupation: Building control surveyor: [employer]  Tobacco Use  . Smoking status: Former Games developer  . Smokeless tobacco: Never Used  Vaping Use  . Vaping Use: Every day  Substance and Sexual Activity  . Alcohol use: Yes    Alcohol/week: 11.0 standard drinks    Types: 11 Cans of beer per week  . Drug use: No  . Sexual activity: Never    Partners: Male    Comment: NuvaRing  Other Topics Concern  . Not on file  Social History Narrative   Lives with her husband.     Education: [employer]   Exercise: Yes   Social Determinants of Dispensing optician  Resource Strain: Not on file  Food Insecurity: Not on file  Transportation Needs: Not on file  Physical Activity: Not on file  Stress: Not on file  Social Connections: Not on file  Intimate Partner Violence: Not on file    Past Medical History, Surgical history, Social history, and Family history were reviewed and updated as appropriate.   Please see review of systems for further details on the patient's review from today.   Objective:   Physical Exam:  There were no vitals taken for this visit.  Physical Exam Neurological:     Mental Status: She is alert and oriented to person, place, and time.     Cranial Nerves: No dysarthria.  Psychiatric:        Attention and Perception: Attention normal.        Mood and Affect: Mood is anxious and depressed. Affect is not tearful.        Speech: Speech normal. Speech is not slurred.        Behavior: Behavior is cooperative.        Thought Content: Thought content normal. Thought content is not paranoid or delusional. Thought content does not include homicidal or suicidal ideation. Thought content does not include homicidal or suicidal plan.        Cognition and Memory: Cognition and memory normal.        Judgment: Judgment normal.     Comments: Not HYpoverbal. Insight ok More irritable  unchanged Anxiety not managed      Review of Systems:  Review of Systems  Constitutional: Negative for fatigue.  Gastrointestinal: Negative for diarrhea.  Genitourinary:         Neurological: Negative for dizziness, tremors and weakness.  Psychiatric/Behavioral: Positive for depression and dysphoric mood. Negative for agitation, behavioral problems, confusion, decreased concentration, hallucinations, self-injury, sleep disturbance and suicidal ideas. The patient is nervous/anxious. The patient is not hyperactive.     Medications: I have reviewed the patient's current medications.  Current Outpatient Medications  Medication Sig Dispense Refill  . Armodafinil 250 MG tablet Take 1 tablet (250 mg total) by mouth daily. 90 tablet 0  . etonogestrel (NEXPLANON) 68 MG IMPL implant 1 each by Subdermal route once.    . gabapentin (NEURONTIN) 300 MG capsule Take 3 capsules (900 mg total) by mouth 3 (three) times daily. 270 capsule 0  . Multiple Vitamin (MULITIVITAMIN WITH MINERALS) TABS Take 1 tablet by mouth daily.    . QUEtiapine (SEROQUEL XR) 400 MG 24 hr tablet Take 3 tablets (1,200 mg total) by mouth at bedtime. 90 tablet 1  . zaleplon (SONATA) 10 MG capsule Take 1 capsule (10 mg total) by mouth at bedtime. 30 capsule 0  . PARoxetine (PAXIL) 20 MG tablet Take 1.5 tablets (30 mg total) by mouth daily. (Patient not taking: Reported on 04/25/2020) 135 tablet 0   No current facility-administered medications for this visit.    Medication Side Effects: dry mouth  Allergies: No Known Allergies  Past Medical History:  Diagnosis Date  . Anxiety   . Chronic shoulder pain   . Depression   . History of tobacco use    Quit 2013    Family History  Problem Relation Age of Onset  . Anxiety disorder Maternal Grandmother   . Mental retardation Maternal Grandmother   . Congestive Heart Failure Maternal Grandfather   . Cancer Maternal Grandfather        lymphoma  . Stroke Paternal Grandfather    . Mental illness Brother  depression  . Hyperlipidemia Brother     Social History   Socioeconomic History  . Marital status: Divorced    Spouse name: [name]  . Number of children: 0  . Years of education: College  . Highest education level: Not on file  Occupational History  . Occupation: Building control surveyor: [employer]  Tobacco Use  . Smoking status: Former Games developer  . Smokeless tobacco: Never Used  Vaping Use  . Vaping Use: Every day  Substance and Sexual Activity  . Alcohol use: Yes    Alcohol/week: 11.0 standard drinks    Types: 11 Cans of beer per week  . Drug use: No  . Sexual activity: Never    Partners: Male    Comment: NuvaRing  Other Topics Concern  . Not on file  Social History Narrative   Lives with her husband.     Education: [employer]   Exercise: Yes   Social Determinants of Corporate investment banker Strain: Not on file  Food Insecurity: Not on file  Transportation Needs: Not on file  Physical Activity: Not on file  Stress: Not on file  Social Connections: Not on file  Intimate Partner Violence: Not on file    Past Medical History, Surgical history, Social history, and Family history were reviewed and updated as appropriate.   Please see review of systems for further details on the patient's review from today.   Objective:   Physical Exam:  There were no vitals taken for this visit.  Physical Exam Neurological:     Mental Status: She is alert and oriented to person, place, and time.     Cranial Nerves: No dysarthria.  Psychiatric:        Attention and Perception: Attention normal.        Mood and Affect: Mood is anxious and depressed. Affect is not tearful.        Speech: Speech normal.        Behavior: Behavior is cooperative.        Thought Content: Thought content normal. Thought content is not paranoid or delusional. Thought content does not include homicidal or suicidal ideation. Thought content does not include homicidal  or suicidal plan.        Cognition and Memory: Cognition and memory normal.        Judgment: Judgment normal.     Comments: HYpoverbal. Insight ok     Lab Review:     Component Value Date/Time   NA 137 01/04/2017 1420   K 4.0 01/04/2017 1420   CL 98 01/04/2017 1420   CO2 23 01/04/2017 1420   GLUCOSE 86 01/04/2017 1420   GLUCOSE 76 07/08/2015 1440   BUN 6 01/04/2017 1420   CREATININE 0.71 01/04/2017 1420   CREATININE 0.66 07/08/2015 1440   CALCIUM 9.3 01/04/2017 1420   PROT 7.9 01/04/2017 1420   ALBUMIN 3.9 01/04/2017 1420   AST 22 01/04/2017 1420   ALT 26 01/04/2017 1420   ALKPHOS 99 01/04/2017 1420   BILITOT 0.2 01/04/2017 1420   GFRNONAA 111 01/04/2017 1420   GFRAA 128 01/04/2017 1420       Component Value Date/Time   WBC 7.7 01/04/2017 1420   WBC 6.4 07/08/2015 1440   RBC 4.48 01/04/2017 1420   RBC 4.38 07/08/2015 1440   HGB 14.3 01/04/2017 1420   HCT 40.9 01/04/2017 1420   PLT 363 01/04/2017 1420   MCV 91 01/04/2017 1420   MCH 31.9 01/04/2017 1420   MCH  32.4 07/08/2015 1440   MCHC 35.0 01/04/2017 1420   MCHC 34.4 07/08/2015 1440   RDW 14.3 01/04/2017 1420   LYMPHSABS 3.0 01/04/2017 1420   MONOABS 384 07/08/2015 1440   EOSABS 0.0 01/04/2017 1420   BASOSABS 0.0 01/04/2017 1420    No results found for: POCLITH, LITHIUM   No results found for: PHENYTOIN, PHENOBARB, VALPROATE, CBMZ   .res Assessment: Plan:    Zoe Ryan was seen today for follow-up, severe bipolar i disorder, current or most recent episode d, depression and anxiety.  Diagnoses and all orders for this visit:  Severe bipolar I disorder, current or most recent episode depressed (HCC)  Panic disorder with agoraphobia  Generalized anxiety disorder  Insomnia due to mental condition  Shift work sleep disorder  Alcohol dependence with uncomplicated intoxication (HCC)   Zoe Ryan had her diagnosis changed from major depression to bipolar depression in March 2021.   Her depression and anxiety had  been treatment resistant.  Her depression, panic and generalized anxiety disorder are all chronic.  It was discovered from a note with [facility]ric [facility] in late 2021 that the patient's drinking and admitted 4-5 drinks per night.  She admits now that she had relapsed on alcohol more significantly about a year ago and had been underreporting the amount she was drinking.  Discussed at length that this could be interfering with the effectiveness of her treatment of depression and anxiety.  However she states she is not willing to stop drinking at this time.  Encouraged her to discuss these issues with her therapist as well which she indicated she would.  Discussed the risk especially of benzodiazepine use with alcohol and we would need to stop the benzodiazepines.  Discussed the risk of withdrawal coming off the benzodiazepines we have prescribe gabapentin to help with this. She reports being successful at reduction to 2 drinks per night.  So far she has not noticed much anxiety benefit from the gabapentin at 900 mg TID.   The alternative of gabapentin would take a longer period of adjustment and is unlikely to have any potential to help with depressive symptoms.  We are going to continue the gabapentin because the irritability caused by paroxetine may be interfering with the benefit potential of gabapentin.  If we decide later it is not helping then we will discontinue it.  The paroxetine appears to be causing marked irritability and mood lability.  It did this at the higher dose of 40 mg as well but then seemed to get better with dosage reduction.  It is not helping adequately for anxiety or depression apparently either so we are going to wean paroxetine.  Discussed SSRI withdrawal.  She will wean paroxetine over 2 weeks and call if she has withdrawal symptoms.    Continue gabapentin to 900 mg 3 times daily off label for anxiety and it may also help reduce her alcohol use.  She claims to have  moderated  No Xanax bc Alcohol abuse.  Discussed potential metabolic side effects associated with atypical antipsychotics, as well as potential risk for movement side effects. Advised pt to contact office if movement side effects occur.   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   Statistically these are the options remaining that have the best odds of helping her bipolar depression. Consider ECT. Off label option pramipexole for TRD.   Reduce Seroquel XR to 800 mg daily for TR bipolar depression and reduction in anxiety.  Higher dose didn't help.  Disc SE.  High dose for TR sx bc tolerating it well and limited options remain.  In 2 weeks if not better switch to Latuda (40 then 80).  Disc SE     Weaned paroxetine DT mood lability and irritabiliity.   Apparently still dealing with adjustment to being off of it with some withdrawal tearfulness.  Feels spacey in the morning which is new.  Disc SE in detail and SSRI withdrawal sx.  Needs longer to adjust.    Shift work managed with Ronne Binning  Seeing therapist Jannet Askew and hasn't discussed traumatic event from the past yet.  Just realized this yesterday.  FU 6 weeks  Meredith Staggers, MD, DFAPA   Please see After Visit Summary for patient specific instructions.  Future Appointments  Date Time Provider Department Center  05/27/2020 11:30 AM Cottle, Steva Ready., MD [facility] None    No orders of the defined types were placed in this encounter.   Future Appointments  Date Time Provider Department Center  05/27/2020 11:30 AM Cottle, Steva Ready., MD [facility] None    No orders of the defined types were placed in this encounter.     -------------------------------

## 2020-05-27 ENCOUNTER — Telehealth (INDEPENDENT_AMBULATORY_CARE_PROVIDER_SITE_OTHER): Payer: No Typology Code available for payment source | Admitting: Psychiatry

## 2020-05-27 ENCOUNTER — Encounter: Payer: Self-pay | Admitting: Psychiatry

## 2020-05-27 DIAGNOSIS — F4001 Agoraphobia with panic disorder: Secondary | ICD-10-CM

## 2020-05-27 DIAGNOSIS — F5105 Insomnia due to other mental disorder: Secondary | ICD-10-CM

## 2020-05-27 DIAGNOSIS — G4726 Circadian rhythm sleep disorder, shift work type: Secondary | ICD-10-CM

## 2020-05-27 DIAGNOSIS — F411 Generalized anxiety disorder: Secondary | ICD-10-CM

## 2020-05-27 DIAGNOSIS — F314 Bipolar disorder, current episode depressed, severe, without psychotic features: Secondary | ICD-10-CM | POA: Diagnosis not present

## 2020-05-27 MED ORDER — LURASIDONE HCL 80 MG PO TABS: 80.0000 mg | ORAL_TABLET | Freq: Every day | ORAL | 1 refills | Status: AC

## 2020-05-27 NOTE — Progress Notes (Signed)
Zoe Ryan [id number] April 28, 1980 40 y.o.   Video Visit via My Chart  I connected with pt by My Chart and verified that I am speaking with the correct person using two identifiers.   I discussed the limitations, risks, security and privacy concerns of performing an evaluation and management service by My Chart  and the availability of in person appointments. I also discussed with the patient that there may be a patient responsible charge related to this service. The patient expressed understanding and agreed to proceed.  I discussed the assessment and treatment plan with the patient. The patient was provided an opportunity to ask questions and all were answered. The patient agreed with the plan and demonstrated an understanding of the instructions.   The patient was advised to call back or seek an in-person evaluation if the symptoms worsen or if the condition fails to improve as anticipated.  I provided 30 minutes of video time during this encounter.  The patient was located at home and the provider was located office. Started [id number]   Subjective:   Patient ID:  Zoe Ryan is a 40 y.o. (DOB 02-13-1981) female.  Chief Complaint:  Chief Complaint  Patient presents with  . Severe bipolar I disorder, current or most recent episode d  . Follow-up  . Depression  . Anxiety    Depression        Associated symptoms include no decreased concentration, no fatigue and no suicidal ideas.  Past medical history includes anxiety.   Anxiety Symptoms include nervous/anxious behavior. Patient reports no chest pain, confusion, decreased concentration, dizziness, nausea, palpitations or suicidal ideas.       Zoe Ryan is seen again today urgently because of recent severe TR bipolar depressive and anxiety symptoms that have been interfering with her ability to work.  AT visit August 10, 2018 when for moderately severe anxiety she was instructed to increase sertraline  from 50 to 75 mg daily.   She's only been taking 50 sertraline bc forgot to increase it.  She was also continued on the recently increased Vraylar 6 mg daily.    At visit August 2020.  Patient was encouraged to increase sertraline from 50 to 75 mg again for panic and anxiety.  She had not done so previously as suggested.  She was not improved as of the last visit. Increased sertraline from 50 to 75 for anxiety and panic.  Continued Wellbutrin 450, Vraylar 6, Xanax XR 1 mg TID, Nuvigil 250, Sonata HS.   visit October 19 and the following changes were made: Increase sertraline further to 100 mg in hopes of further reducing generalized anxiety disorder and depression.  The sertraline has blocked of the panic attacks.  It made a little difference but not that much.  Anxiety is worse than depression.   visit February 23, 2019.  She was taking sertraline 100 mg daily.  She felt the sertraline was causing some irritability.  We switched to paroxetine 20 mg.  Noticed a decrease in depression and anxiety and not as irritable with switch to paroxetine.  Tolerating well at HS. Daytime paroxetine dizzy.  Improvement plateau.  dep 4/10.  Anxiety 5/10.   Less work anxiety and a bit more satisfied overall.  Less tearful.   No mood swings.  Anxiety reduced to with work..  No recent panic.  A little more energy.  Still fairly Low motivation and interest.  Still Want to sleep and watch TV.  Stopped exercise bc motivation but trying to get  better.  Poor productivity at home without change. 1 Missed days work DT depression.  Functioning well at work. Intermittent FMLA .  Can struggle to maintain job interest but may be passing.  Been there 10 years.  No mood swings.  Can enjoy things. Good sleep.  Normally 8-9 hours sleep.  On weekend can sleep 10 hours then nap.  seen April 06, 2019.  The following change was made:  Increase paroxetine to 30 mg daily to further reduce anxiety and potentially depression .  She remained on Vraylar 6 mg, Wellbutrin  XL 450,  Nuvigil 250 mg, Xanax XR 1 mg 3 times daily. She called back March 11 complaining of feeling numb all the time as well as depression and weight gain.  It was affecting work.  We will put her on the cancellation list to try to get an appointment as soon as possible as there was not an obvious med adjustment that could be made outside of an appointment. She cut back the paroxetine to 20 mg bc gain 15#, feeling numb, and trouble concentrating about March 11.   07/11/2019 appointment with the following noted: Doing better overall with SE paroxetine.  A little more focus.  Feeling less numb.  Dep 5/10.  Anxiety 6/10.  Not much progress over the last couple of mos.  Still low interest, enjoyment, motivation.  Missed 1 day in last month but running late and leaving early.  No panic.  Thinks paroxetine helps anxiety some.  Sticking with 3 daily of Xanax XR 1 mg.  Xanax 0.5 about 7 times per week for sleep. Sleep good with more disciplined schedule helped. 8 hours.  If can will sleep a lot.  No NM. Plan:Increase Seroquel XR to 300 mg daily for TR bipolar depression.  Disc SE.   Try to wean HS Xanax.  08/16/2019 appointment with the following noted: No additional improvement with increase Seroquel but no addional SE either. Dep & anxiety 6-7/10 worse than last time. Work function reduced and had to leave early a couple of days.  Couple of panic before work. Sleep variable from normal to excessive.  Started a new diet with restrictive calories and reinjured knee only sig stressors. Plan: For treatment resistant bipolar depression consider switch to [facility] though she has had a partial response with Vraylar.  Statistically these are the options remaining that have the best odds of helping her bipolar depression. Consider ECT. Off label option pramipexole for TRD.  Increase Seroquel XR to 600 mg daily for TR bipolar depression.  Disc SE.   Try to wean HS Xanax.  Call before July 1 if no improvement and  we'll consider increasing the Seroquel XR to 800 mg daily.  09/24/2019 phone call with patient requesting early refill of Xanax saying she had been very stressed out and had taken more than prescribed MD response:  She is already on a high dose of Xanax and exceeding the dosage Rx and running out a month early is entirely unacceptable and puts her at risk of seizures.  She is getting 2 different forms of Xanax.  PDMP shows: 07/24/2019  2   07/24/2019  Alprazolam 0.5 MG Tablet  90.00  90 Ca Cot   [id number]   Nor ([id number])   0/0  1.00 LME  Comm Ins   [place]  07/24/2019  2   07/24/2019  Alprazolam Xr 1 MG Tablet  270.00  90           If she cannot  comply I will take her off the med.  To more closely monitor her compliance I will RX it to her 1 week at a time until we can establish her ability to comply. I sent in 1 week supply of each.  11/07/2019 appointment with the following noted: Never ran out of Xanax but feared it.  Was highly anxious anticipating surgery 3-4 weeks ago.  Went well  No complications and getting back to normal work now.  At work last week. Increase Seroquel XR to 600 mg nightly.  Was in a lot of pain at the time and hard to say the effect.   Depression 4/10, anxiety 6/10.  Sleeping well.  8 hours.  More on weekends.  No SE. Has panic at least 2 / week and usually triggered by work or social unfamiliar places.   No mania nor mood swings or agitation.  Work is best function in a long time. Plan: Increase Seroquel XR to 800 mg daily for TR bipolar depression and reduction in anxiety.  Disc SE.   Increase paroxetine 40 mg daily for panic and anxiety bc poor control  01/07/2020 appointment with the following noted: Had 2-3 happy days but otherwise not much change.  Was quite sick last week which was horrible.Zoe Ryan. Wonders about dx criteria for PTSD.  Had a rough time 15 years ago without meds.  Lived in house with friends and it ballooned into weird gaslighting of people doing things  behind her back.  Was depressed and self medicating with alcohol.  If get this slight feeling or rejection then all the old hurt comes back.  This might be why haven't made new friends since that incident.  Sounds benign but they really messed with my head.  Made me sound so paranoid and delusional which reinforced their idea she wasn't welcome anymore.  Still getting triggered by silly stuff like that.  Anything that reminds me of that makes it hard to function.  No SE.  Sleeping long bc recently sick and before that was 8-10 hours. 1 panic since here on Monday morning. depression 6/10 and anxiety 4/10. Occ crying spells.  Sometimes interest.   Gained weight over time since broken leg and change in job or marriage.  In weight management program now.  Plan: Increase Seroquel XR to 1200 mg daily for TR bipolar depression and reduction in anxiety.  Disc SE.  High dose for TR sx bc tolerating it well and limited options remain. Consider Increase but continue for now paroxetine 40 mg daily for panic and anxiety bc poor control  01/24/20 note: [facility] with recommended taper therafter. Started Optifast plan in May 2021. Follow knee surgery, states she relapsed EtOH use. As above, now having 4-5 drinks per night.   BC of this we are limiting Xanax.  But will not stop yet DT risk withdrawal problems is great.  She has appt here soon.  Will put her on cancellation list.  01/30/20 appt with following noted: Pt now saying she's only drinking 1-2 drinks nightly.  Admits relapse into alcohol for about a year.  Doesn't think she's ready to stop drinking. Never had any alcohol treatment.  In past drug problems, marijuana and hallucinogens.  Denies opiate abuse.   Pat FHx D&A abuse.  Seeing therapist every other week. No noticeable change with med changes. Mood labillity with crying at one point and couple hours later laughing and  feeling like a grand old time without reason.  Most days labile. Probably worse with increase paroxetine to 40 mg daily. No SE with meds.   No SI but did have self harm thoughts like cutting with knife when feeling down.   Last cutting a decade ago. More anxious. And more panic Plan: Continue Seroquel XR to 1200 mg daily for TR bipolar depression and reduction in anxiety.  Disc SE.  High dose for TR sx bc tolerating it well and limited options remain. Reduce paroxetine 30 mg daily  DT mood lability with 40 mg daily and hopefully still help anxiety Start gabapentin 300 mg TID for anxiety off label and help ease WD from Xanax and it might help with alcohol abuse.  Increase gabapentin as tolerated up to 600 mg TID.  DC Xanax bc Alcohol abuse.  02/18/2020 appointment with the following noted: Going OK.  Not a lot of change since here.  Been out of town for 10 days so hard to say.  Gabapentin not that helpful at 600 TID.  No SE. Not as much mood swings.  Depression is not severe 5/10.  Anxiety is 7.5/10. Tolerating meds. Has cut back alcohol to 1-2 beers daily.  Replacing with diet soda or sparkling water.   No cutting or self harm.    Still benefitting from Nuvigil.  Plan: Increase gabapentin to 900 mg 3 times daily off label for anxiety and it may also help reduce her alcohol use.  She claims to have moderated DC Xanax bc Alcohol abuse.  Florentina Addison T   SW  03/13/20 3:46 PM Note Pt left a message that she is having panic attacks daily and would like some medicine to help with that. Pt has an appt on 03/27/20     03/27/2020 appointment with the following noted: Compliant with meds.  No anxiety benefit with higher dose gabapentin. Crazy mood swings and bad irritability.  Never this irritable in her life at everyone.  Got worse about a month ago. No change in sleep pattern.  Poor energy.  Uncomfortable nervous energy.  Denies increase speech or spending.  Working.  Trouble finishing projects  and motivation problems.  Panic at least 3 times a week before she goes in to work.  Not really racing thoughts.  Can feel a little paranoid like people are out to get her and doesn't remember that before. 7 hours sleep.  No SI. Moderate depression. Plan: Wean paroxetine DT mood lability and irritabiliity.   Continue gabapentin to 900 mg 3 times  Continue high-dose quetiapine XR 1200 mg daily for a longer trial.  04/25/2020 appointment with following noted: Weaned off paroxetine which was a little rough. Irritability is no better and maybe worse and crying spells are worse.  Been off about 2 weeks.    Depression 7/10.  Anxiety 7/10.  Reasons to be anxious: surgery knee next week.  Out of work for 4 weeks.    Will be $ stress bc only a couple of days PTO.  Rare missed work with crying.  Having panic attacks 1-2 times a day before or during work.  Trying to work.  Short-staffed and a lot on her plate with work. Really well with alcohol overall.  Only a couple of nights per week and a couple of drinks.  Plan: Reduce Seroquel XR to 800 mg daily for TR bipolar depression and reduction in anxiety.  Higher dose didn't help.  Disc SE.  High dose for TR sx bc tolerating  it well and limited options remain.  In 2 weeks if not better switch to Latuda (40 then 80).  Disc SE     In March 2020 her diagnosis was changed to bipolar depression.    05/27/2020 appointment with the following noted  did not start Latuda. Had surgery and hadn't been at work in a month.  Started yesterday.  During the month mood was alright but not great.  Wasn't bad.  Surgery interfered with all activity until the last week. A lot of anxiety but it was reduced from work.  No panic out of work but 2 yesterday at work.  Was nervous about RTW. 3 crying spellls yesterday but not as bad as feared. Did not reduce Seroquel by mistake still on 1200 mg daily. Irritability is a little better off paroxetine but sitation different out of work.    Failed psychiatric medications include:  Lexapro,  paroxetine 40 mood lability,  sertraline 100 more irritable,  fluoxetine, venlafaxine, Wellbutrin 450, duloxetine 120  ,  Failed to respond to lexapro plus Rexulti lithium,  lamotrigine doesn't remember why she stopped, valproic acid,  Abilify, Rexulti, Seroquel XR 1200 mg daily. Had some mood improvement in stability with increase Vraylar to 6. She says the propranolol is not very helpful for anxiety, but taken it once or twice for tremor with anxiety. Ambien amnesia  Review of Systems:  Review of Systems  Constitutional: Negative for fatigue.  Cardiovascular: Negative for chest pain and palpitations.  Gastrointestinal: Negative for diarrhea and nausea.  Genitourinary:       Stress diarrhea resolved    Musculoskeletal: Positive for arthralgias and joint swelling.  Neurological: Negative for dizziness, tremors and weakness.  Psychiatric/Behavioral: Positive for depression and dysphoric mood. Negative for agitation, behavioral problems, confusion, decreased concentration, hallucinations, self-injury, sleep disturbance and suicidal ideas. The patient is nervous/anxious. The patient is not hyperactive.     Medications: I have reviewed the patient's current medications.  Current Outpatient Medications  Medication Sig Dispense Refill  . Armodafinil 250 MG tablet Take 1 tablet (250 mg total) by mouth daily. 90 tablet 0  . etonogestrel (NEXPLANON) 68 MG IMPL implant 1 each by Subdermal route once.    . gabapentin (NEURONTIN) 300 MG capsule Take 3 capsules (900 mg total) by mouth 3 (three) times daily. 270 capsule 0  . lurasidone (LATUDA) 80 MG TABS tablet Take 1 tablet (80 mg total) by mouth daily with breakfast. 30 tablet 1  . Multiple Vitamin (MULITIVITAMIN WITH MINERALS) TABS Take 1 tablet by mouth daily.    . QUEtiapine (SEROQUEL XR) 400 MG 24 hr tablet Take 3 tablets (1,200 mg total) by mouth at bedtime. 90 tablet 1  . zaleplon  (SONATA) 10 MG capsule Take 1 capsule (10 mg total) by mouth at bedtime. 30 capsule 0   No current facility-administered medications for this visit.    Medication Side Effects: None  Allergies: No Known Allergies  Past Medical History:  Diagnosis Date  . Anxiety   . Chronic shoulder pain   . Depression   . History of tobacco use    Quit 2013    Family History  Problem Relation Age of Onset  . Anxiety disorder Maternal Grandmother   . Mental retardation Maternal Grandmother   . Congestive Heart Failure Maternal Grandfather   . Cancer Maternal Grandfather        lymphoma  . Stroke Paternal Grandfather   . Mental illness Brother        depression  .  Hyperlipidemia Brother     Social History   Socioeconomic History  . Marital status: Divorced    Spouse name: [name]  . Number of children: 0  . Years of education: College  . Highest education level: Not on file  Occupational History  . Occupation: Building control surveyor: [employer]  Tobacco Use  . Smoking status: Former Games developer  . Smokeless tobacco: Never Used  Vaping Use  . Vaping Use: Every day  Substance and Sexual Activity  . Alcohol use: Yes    Alcohol/week: 11.0 standard drinks    Types: 11 Cans of beer per week  . Drug use: No  . Sexual activity: Never    Partners: Male    Comment: NuvaRing  Other Topics Concern  . Not on file  Social History Narrative   Lives with her husband.     Education: [employer]   Exercise: Yes   Social Determinants of Corporate investment banker Strain: Not on file  Food Insecurity: Not on file  Transportation Needs: Not on file  Physical Activity: Not on file  Stress: Not on file  Social Connections: Not on file  Intimate Partner Violence: Not on file    Past Medical History, Surgical history, Social history, and Family history were reviewed and updated as appropriate.   Please see review of systems for further details on the patient's review from today.    Objective:   Physical Exam:  There were no vitals taken for this visit.  Physical Exam Neurological:     Mental Status: She is alert and oriented to person, place, and time.     Cranial Nerves: No dysarthria.  Psychiatric:        Attention and Perception: Attention normal.        Mood and Affect: Mood is anxious and depressed. Affect is not tearful.        Speech: Speech normal. Speech is not slurred.        Behavior: Behavior is cooperative.        Thought Content: Thought content normal. Thought content is not paranoid or delusional. Thought content does not include homicidal or suicidal ideation. Thought content does not include homicidal or suicidal plan.        Cognition and Memory: Cognition and memory normal.        Judgment: Judgment normal.     Comments: Not HYpoverbal. Insight ok Affect better Anxiety not managed      Review of Systems:  Review of Systems  Constitutional: Negative for fatigue.  Gastrointestinal: Negative for diarrhea.  Genitourinary:         Neurological: Negative for dizziness, tremors and weakness.  Psychiatric/Behavioral: Positive for depression and dysphoric mood. Negative for agitation, behavioral problems, confusion, decreased concentration, hallucinations, self-injury, sleep disturbance and suicidal ideas. The patient is nervous/anxious. The patient is not hyperactive.     Medications: I have reviewed the patient's current medications.  Current Outpatient Medications  Medication Sig Dispense Refill  . Armodafinil 250 MG tablet Take 1 tablet (250 mg total) by mouth daily. 90 tablet 0  . etonogestrel (NEXPLANON) 68 MG IMPL implant 1 each by Subdermal route once.    . gabapentin (NEURONTIN) 300 MG capsule Take 3 capsules (900 mg total) by mouth 3 (three) times daily. 270 capsule 0  . lurasidone (LATUDA) 80 MG TABS tablet Take 1 tablet (80 mg total) by mouth daily with breakfast. 30 tablet 1  . Multiple Vitamin (MULITIVITAMIN WITH MINERALS)  TABS Take  1 tablet by mouth daily.    . QUEtiapine (SEROQUEL XR) 400 MG 24 hr tablet Take 3 tablets (1,200 mg total) by mouth at bedtime. 90 tablet 1  . zaleplon (SONATA) 10 MG capsule Take 1 capsule (10 mg total) by mouth at bedtime. 30 capsule 0   No current facility-administered medications for this visit.    Medication Side Effects: dry mouth  Allergies: No Known Allergies  Past Medical History:  Diagnosis Date  . Anxiety   . Chronic shoulder pain   . Depression   . History of tobacco use    Quit 2013    Family History  Problem Relation Age of Onset  . Anxiety disorder Maternal Grandmother   . Mental retardation Maternal Grandmother   . Congestive Heart Failure Maternal Grandfather   . Cancer Maternal Grandfather        lymphoma  . Stroke Paternal Grandfather   . Mental illness Brother        depression  . Hyperlipidemia Brother     Social History   Socioeconomic History  . Marital status: Divorced    Spouse name: [name]  . Number of children: 0  . Years of education: College  . Highest education level: Not on file  Occupational History  . Occupation: Building control surveyor: [employer]  Tobacco Use  . Smoking status: Former Games developer  . Smokeless tobacco: Never Used  Vaping Use  . Vaping Use: Every day  Substance and Sexual Activity  . Alcohol use: Yes    Alcohol/week: 11.0 standard drinks    Types: 11 Cans of beer per week  . Drug use: No  . Sexual activity: Never    Partners: Male    Comment: NuvaRing  Other Topics Concern  . Not on file  Social History Narrative   Lives with her husband.     Education: [employer]   Exercise: Yes   Social Determinants of Corporate investment banker Strain: Not on file  Food Insecurity: Not on file  Transportation Needs: Not on file  Physical Activity: Not on file  Stress: Not on file  Social Connections: Not on file  Intimate Partner Violence: Not on file    Past Medical History, Surgical history, Social  history, and Family history were reviewed and updated as appropriate.   Please see review of systems for further details on the patient's review from today.   Objective:   Physical Exam:  There were no vitals taken for this visit.  Physical Exam Neurological:     Mental Status: She is alert and oriented to person, place, and time.     Cranial Nerves: No dysarthria.  Psychiatric:        Attention and Perception: Attention normal.        Mood and Affect: Mood is anxious and depressed. Affect is not tearful.        Speech: Speech normal.        Behavior: Behavior is cooperative.        Thought Content: Thought content normal. Thought content is not paranoid or delusional. Thought content does not include homicidal or suicidal ideation. Thought content does not include homicidal or suicidal plan.        Cognition and Memory: Cognition and memory normal.        Judgment: Judgment normal.     Comments: HYpoverbal. Insight ok     Lab Review:     Component Value Date/Time   NA 137 01/04/2017 1420  K 4.0 01/04/2017 1420   CL 98 01/04/2017 1420   CO2 23 01/04/2017 1420   GLUCOSE 86 01/04/2017 1420   GLUCOSE 76 07/08/2015 1440   BUN 6 01/04/2017 1420   CREATININE 0.71 01/04/2017 1420   CREATININE 0.66 07/08/2015 1440   CALCIUM 9.3 01/04/2017 1420   PROT 7.9 01/04/2017 1420   ALBUMIN 3.9 01/04/2017 1420   AST 22 01/04/2017 1420   ALT 26 01/04/2017 1420   ALKPHOS 99 01/04/2017 1420   BILITOT 0.2 01/04/2017 1420   GFRNONAA 111 01/04/2017 1420   GFRAA 128 01/04/2017 1420       Component Value Date/Time   WBC 7.7 01/04/2017 1420   WBC 6.4 07/08/2015 1440   RBC 4.48 01/04/2017 1420   RBC 4.38 07/08/2015 1440   HGB 14.3 01/04/2017 1420   HCT 40.9 01/04/2017 1420   PLT 363 01/04/2017 1420   MCV 91 01/04/2017 1420   MCH 31.9 01/04/2017 1420   MCH 32.4 07/08/2015 1440   MCHC 35.0 01/04/2017 1420   MCHC 34.4 07/08/2015 1440   RDW 14.3 01/04/2017 1420   LYMPHSABS 3.0  01/04/2017 1420   MONOABS 384 07/08/2015 1440   EOSABS 0.0 01/04/2017 1420   BASOSABS 0.0 01/04/2017 1420    No results found for: POCLITH, LITHIUM   No results found for: PHENYTOIN, PHENOBARB, VALPROATE, CBMZ   .res Assessment: Plan:    Tieisha was seen today for severe bipolar i disorder, current or most recent episode d, follow-up, depression and anxiety.  Diagnoses and all orders for this visit:  Severe bipolar I disorder, current or most recent episode depressed (HCC) -     lurasidone (LATUDA) 80 MG TABS tablet; Take 1 tablet (80 mg total) by mouth daily with breakfast.  Panic disorder with agoraphobia  Generalized anxiety disorder  Insomnia due to mental condition  Shift work sleep disorder   Kaelen had her diagnosis changed from major depression to bipolar depression in March 2021.   Her depression and anxiety had been treatment resistant.  Her depression, panic and generalized anxiety disorder are all chronic.  It was discovered from a note with bariatric services at [facility] in late 2021 that the patient's drinking and admitted 4-5 drinks per night.  She admits now that she had relapsed on alcohol more significantly about a year ago and had been underreporting the amount she was drinking.  Discussed at length that this could be interfering with the effectiveness of her treatment of depression and anxiety.  However she states she is not willing to stop drinking at this time.  Encouraged her to discuss these issues with her therapist as well which she indicated she would.  Discussed the risk especially of benzodiazepine use with alcohol and we would need to stop the benzodiazepines.  Discussed the risk of withdrawal coming off the benzodiazepines we have prescribe gabapentin to help with this. She reports being successful at reduction to 2 drinks per night.  So far she has not noticed much anxiety benefit from the gabapentin at 900 mg TID.   The alternative of gabapentin would  take a longer period of adjustment and is unlikely to have any potential to help with depressive symptoms.  We are going to continue the gabapentin because the irritability caused by paroxetine may be interfering with the benefit potential of gabapentin.  If we decide later it is not helping then we will discontinue it.  The paroxetine appears to be causing marked irritability and mood lability.  It did  this at the higher dose of 40 mg as well but then seemed to get better with dosage reduction.  It is not helping adequately for anxiety or depression apparently either so we are going to wean paroxetine.  Discussed SSRI withdrawal.  She will wean paroxetine over 2 weeks and call if she has withdrawal symptoms.    Continue gabapentin to 900 mg 3 times daily off label for anxiety and it may also help reduce her alcohol use.  She claims to have moderated  No Xanax bc Alcohol abuse.  Discussed potential metabolic side effects associated with atypical antipsychotics, as well as potential risk for movement side effects. Advised pt to contact office if movement side effects occur.   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   Statistically these are the options remaining that have the best odds of helping her bipolar depression. Consider ECT. Off label option pramipexole for TRD.   Gradually wean Seroquel XR switch to Latuda (40 then 80).  Disc SE    Reduce seroquel XR 800 mg and add 1/2 Latuda 80 for 1 week then Reduce Seroquel to 400 mg and increase Latuda to 1 tablet for 1 week, then Reduce Seroquel to 1/2 of 400 mg tablet Disc withdrawal and rebound insomnia.  Shift work managed with Ronne Binning  Seeing therapist Jannet Askew and hasn't discussed traumatic event from the past yet.  Just realized this yesterday.  FU 6 weeks  Meredith Staggers, MD, DFAPA   Please see After Visit Summary for patient specific instructions.  No future appointments.  No orders of the  defined types were placed in this encounter.   No future appointments.  No orders of the defined types were placed in this encounter.     -------------------------------

## 2020-06-02 ENCOUNTER — Other Ambulatory Visit: Payer: Self-pay

## 2020-06-02 ENCOUNTER — Telehealth: Payer: Self-pay | Admitting: Psychiatry

## 2020-06-02 MED ORDER — LURASIDONE HCL 40 MG PO TABS: ORAL_TABLET | ORAL | 0 refills | Status: AC

## 2020-06-02 NOTE — Telephone Encounter (Signed)
Rtc to pt and she reports starting Latuda on Wednesday, March 23rd she did get nauseated first night and yes she is eating more than 350 calories she says. Then since Thursday she is vomiting. She feels better after she vomits but obviously throws up medication. I asked about sleeping and she is having trouble falling asleep as well with coming off of Seroquel XR.   Informed her I would touch base with Dr. Jennelle Human and f/u

## 2020-06-02 NOTE — Telephone Encounter (Signed)
Pt left a message stating that she was put on a new medication and that she is having side effects and doesn't know what to do. Please call her at [phone]

## 2020-06-02 NOTE — Telephone Encounter (Signed)
Rtc and gave instructions but pt requesting lower dose of Latuda, she has 80 mg tablet and too difficult to get a 1/4 tablet. Will send lower dose to [facility]

## 2020-06-02 NOTE — Telephone Encounter (Signed)
First try splitting the Latuda up by taking 20 mg twice daily with food and don't increase it until NV resolves.  Coming off Seroquel could also contribute so be patient.  If this doesn't work we'll taper Seroquel and Latuda more slowly.

## 2020-06-06 ENCOUNTER — Telehealth: Payer: Self-pay | Admitting: Psychiatry

## 2020-06-06 NOTE — Telephone Encounter (Signed)
Pt left a message stating that she is still having very bad side effects and needs to know what to do. Please call her back at [phone]

## 2020-06-06 NOTE — Telephone Encounter (Signed)
Pt has been on Latuda for a week and is experiencing excessive sweating,mood shifts,and bouts of crying.She has also not been sleeping well.Please advise

## 2020-06-09 ENCOUNTER — Other Ambulatory Visit: Payer: Self-pay | Admitting: Psychiatry

## 2020-06-09 NOTE — Telephone Encounter (Signed)
She is trying to wean off of Seroquel and onto Jordan.  This is not an easy switch and some of the problems she is having are likely related to coming off the Seroquel in particular the problem sleeping.  Seroquel did not work adequately and we need to make this switch successful so she has a chance to get benefit from Jordan.  The Latuda dose may need to be adjusted.  How much Seroquel is she taking currently?  How much Kasandra Knudsen is she taking currently and at what time of day?  Part of the problem with sleep may be helped by switching the Latuda to the morning but she may need to come off Seroquel more slowly in order to keep from having mood swings and insomnia.  I can give him more detailed recommendations once I know what her current dosages are.

## 2020-06-09 NOTE — Telephone Encounter (Signed)
Pt informed me she is taking Seroquel twice a day.She stopped taking the Latuda on Friday because she could not stand the side effects.She was taking 120 mg twice a day.She did not inform me the time a day and she was adamant that she did not want to take it.I did inform her of your message and told her I will call back with anymore suggestions from you.

## 2020-06-10 NOTE — Telephone Encounter (Signed)
Next apt 5/12 

## 2020-06-12 ENCOUNTER — Other Ambulatory Visit: Payer: Self-pay | Admitting: Psychiatry

## 2020-06-12 DIAGNOSIS — F4001 Agoraphobia with panic disorder: Secondary | ICD-10-CM

## 2020-06-12 DIAGNOSIS — F10220 Alcohol dependence with intoxication, uncomplicated: Secondary | ICD-10-CM

## 2020-06-13 NOTE — Telephone Encounter (Signed)
Please review

## 2020-06-13 NOTE — Telephone Encounter (Signed)
reviewed

## 2020-06-13 NOTE — Telephone Encounter (Signed)
Whatever medication we switch her to will require that she come off of Seroquel which does not work adequately.  It is best to taper the Seroquel gradually because there are some withdrawal effects.  It is likely that some of the things she interpreted as side effects of Latuda were withdrawal effects.  I understand she does not want to take Latuda and I will not prescribe it again but I would like her to reduce the Seroquel XR 400 mg tablets to 1-1/2 each night.  By splitting one of the tablets that will improve her sleep.

## 2020-06-13 NOTE — Telephone Encounter (Signed)
Pt informed.She will call us back if she has any other problems.

## 2020-06-16 NOTE — Telephone Encounter (Signed)
Pt LM on VM stating she is very confused and frustrated w/ meds. Asking Nurse to return her call after 10:00. She worked last night & will not be available early morning. [phone]

## 2020-06-17 ENCOUNTER — Other Ambulatory Visit: Payer: Self-pay

## 2020-06-17 ENCOUNTER — Telehealth: Payer: Self-pay

## 2020-06-17 DIAGNOSIS — F411 Generalized anxiety disorder: Secondary | ICD-10-CM

## 2020-06-17 DIAGNOSIS — F314 Bipolar disorder, current episode depressed, severe, without psychotic features: Secondary | ICD-10-CM

## 2020-06-17 DIAGNOSIS — F5105 Insomnia due to other mental disorder: Secondary | ICD-10-CM

## 2020-06-17 MED ORDER — QUETIAPINE FUMARATE ER 400 MG PO TB24
800.0000 mg | ORAL_TABLET | Freq: Every day | ORAL | 1 refills | Status: AC
Start: 2020-06-17 — End: ?

## 2020-06-17 NOTE — Telephone Encounter (Signed)
We are attempting to reduce the dosage of the Seroquel XR.  The higher dosage did not offer sufficient benefit to justify the dose.  Reduce the Seroquel XR to 2 tablets of the 400 mg size nightly.  That should be an easy adjustment.  I would suggest in a couple of weeks she call back and we reduce the Seroquel XR to 600 mg at night.  This is because what ever medicine we switch to will likely interact with the high dose of Seroquel.

## 2020-06-17 NOTE — Telephone Encounter (Signed)
Pt left msg she was confused about her medication. Rtc to pt and reviewed with her the phone msgs and she reports she is currently taking Seroquel XR 400 mg 2.5 tablets at hs. She does not take it bid. She is trying to slowly taper down. She reports she had too many side effects with Latuda, in addition, she reports being on this very strict diet and to get 350 calories in at one time is too hard. Asked her why the medication change and she is unclear of why. Informed her I would discuss with Dr. Jennelle Human and get back with her.

## 2020-06-17 NOTE — Telephone Encounter (Signed)
Rtc to pt and discussed her Seroquel, given instructions and she verbalized understanding. She did need a Rx for Seroquel, it was submitted to [facility]. She will call back with any issues.

## 2020-07-17 ENCOUNTER — Telehealth: Payer: No Typology Code available for payment source | Admitting: Psychiatry
